# Patient Record
Sex: Female | Born: 1985 | Race: White | Hispanic: Yes | Marital: Married | Smoking: Former smoker
Health system: Southern US, Community
[De-identification: ages and names within clinical notes are randomized; demographics above are authoritative.]

## PROBLEM LIST (undated history)

## (undated) DIAGNOSIS — O24419 Gestational diabetes mellitus in pregnancy, unspecified control: Secondary | ICD-10-CM

## (undated) HISTORY — PX: COSMETIC SURGERY: SHX468

---

## 2015-02-25 NOTE — L&D Delivery Note (Signed)
Delivery Note At 4:40 AM a viable female was delivered via Vaginal, Spontaneous Delivery (Presentation:vertex ;LOA  ).  APGAR: 9, 9; weight  .   Placenta status:spontaneous ,duncan .  Cord: 3VC with the following complications:none .  Cord pH: n/a  Anesthesia:  epidural Episiotomy: None Lacerations: None Suture Repair: n/a Est. Blood Loss (mL):  100  Mom to postpartum.  Baby to Couplet care / Skin to Skin.  Cheryl Wolfe 11/26/2015, 4:54 AM

## 2015-04-03 LAB — OB RESULTS CONSOLE HGB/HCT, BLOOD
HEMATOCRIT: 42 %
HEMOGLOBIN: 13.6 g/dL

## 2015-04-03 LAB — OB RESULTS CONSOLE ABO/RH: RH TYPE: POSITIVE

## 2015-04-03 LAB — OB RESULTS CONSOLE HEPATITIS B SURFACE ANTIGEN: HEP B S AG: NEGATIVE

## 2015-04-03 LAB — OB RESULTS CONSOLE HIV ANTIBODY (ROUTINE TESTING): HIV: NONREACTIVE

## 2015-04-03 LAB — CYSTIC FIBROSIS DIAGNOSTIC STUDY: Interpretation-CFDNA:: NEGATIVE

## 2015-04-03 LAB — OB RESULTS CONSOLE PLATELET COUNT: Platelets: 413 10*3/uL

## 2015-04-03 LAB — OB RESULTS CONSOLE GC/CHLAMYDIA
Chlamydia: NEGATIVE
Gonorrhea: NEGATIVE

## 2015-04-04 LAB — CYTOLOGY - PAP: Pap: NEGATIVE

## 2015-10-15 ENCOUNTER — Encounter: Payer: Self-pay | Admitting: Obstetrics and Gynecology

## 2015-10-15 ENCOUNTER — Ambulatory Visit (INDEPENDENT_AMBULATORY_CARE_PROVIDER_SITE_OTHER): Payer: Medicaid Other | Admitting: Obstetrics and Gynecology

## 2015-10-15 VITALS — BP 102/72 | HR 74 | Ht 61.0 in | Wt 189.0 lb

## 2015-10-15 DIAGNOSIS — Z3483 Encounter for supervision of other normal pregnancy, third trimester: Secondary | ICD-10-CM | POA: Diagnosis not present

## 2015-10-15 DIAGNOSIS — Z3490 Encounter for supervision of normal pregnancy, unspecified, unspecified trimester: Secondary | ICD-10-CM | POA: Insufficient documentation

## 2015-10-15 MED ORDER — PREPLUS 27-1 MG PO TABS: 1.0000 | ORAL_TABLET | Freq: Every day | ORAL | 13 refills | Status: DC

## 2015-10-15 NOTE — Progress Notes (Signed)
Needs 28 wks labs Subjective:  Cheryl DessertDiana Wolfe is a 30 y.o. G4P3003 at 1630w6d being seen today for ongoing prenatal care. [facility] She reports that her OB care has been unremarkable. Has had TSVD x 3 without problems.   She is currently monitored for the following issues for this low-risk pregnancy and has Supervision of normal pregnancy, antepartum on her problem list.  Patient reports no complaints.  Contractions: Irregular. Vag. Bleeding: None.  Movement: Present. Denies leaking of fluid.   The following portions of the patient's history were reviewed and updated as appropriate: allergies, current medications, past family history, past medical history, past social history, past surgical history and problem list. Problem list updated.  Objective:   Vitals:   10/15/15 1027 10/15/15 1030  BP: 102/72   Pulse: 74   Weight: 189 lb (85.7 kg)   Height:  5\' 1"  (1.549 m)    Fetal Status:     Movement: Present     General:  Alert, oriented and cooperative. Patient is in no acute distress.  Skin: Skin is warm and dry. No rash noted.   Cardiovascular: Normal heart rate noted  Respiratory: Normal respiratory effort, no problems with respiration noted  Abdomen: Soft, gravid, appropriate for gestational age. Pain/Pressure: Present     Pelvic:  Cervical exam deferred        Extremities: Normal range of motion.  Edema: None  Mental Status: Normal mood and affect. Normal behavior. Normal judgment and thought content.   Urinalysis:      Assessment and Plan:  Pregnancy: G4P3003 at 4030w6d  1. Supervision of normal pregnancy, antepartum, third trimester - Glucose tolerance, 2 hours - CBC - HIV antibody - RPR - Tdap vaccine greater than or equal to 7yo IM  Preterm labor symptoms and general obstetric precautions including but not limited to vaginal bleeding, contractions, leaking of fluid and fetal movement were reviewed in detail with the patient. Please refer to After Visit  Summary for other counseling recommendations.  Return in about 2 weeks (around 10/29/2015) for OB visit.   Hermina StaggersMichael L Haydyn Liddell, MD

## 2015-10-15 NOTE — Addendum Note (Signed)
Addended by: Hermina StaggersERVIN, Johnye Kist L on: 10/15/2015 11:45 AM   Modules accepted: Orders

## 2015-10-17 ENCOUNTER — Other Ambulatory Visit: Payer: Medicaid Other

## 2015-10-19 ENCOUNTER — Other Ambulatory Visit: Payer: Medicaid Other

## 2015-10-19 DIAGNOSIS — Z3493 Encounter for supervision of normal pregnancy, unspecified, third trimester: Secondary | ICD-10-CM

## 2015-10-20 LAB — GLUCOSE TOLERANCE, 2 HOURS W/ 1HR
GLUCOSE, 1 HOUR: 147 mg/dL (ref 65–179)
GLUCOSE, 2 HOUR: 169 mg/dL — AB (ref 65–152)
Glucose, Fasting: 72 mg/dL (ref 65–91)

## 2015-10-22 LAB — CBC
HEMATOCRIT: 39.1 % (ref 34.0–46.6)
Hemoglobin: 12.6 g/dL (ref 11.1–15.9)
MCH: 28.3 pg (ref 26.6–33.0)
MCHC: 32.2 g/dL (ref 31.5–35.7)
MCV: 88 fL (ref 79–97)
Platelets: 335 10*3/uL (ref 150–379)
RBC: 4.45 x10E6/uL (ref 3.77–5.28)
RDW: 14.3 % (ref 12.3–15.4)
WBC: 12.6 10*3/uL — ABNORMAL HIGH (ref 3.4–10.8)

## 2015-10-22 LAB — GLUCOSE TOLERANCE, 2 HOURS

## 2015-10-22 LAB — HIV ANTIBODY (ROUTINE TESTING W REFLEX): HIV Screen 4th Generation wRfx: NONREACTIVE

## 2015-10-22 LAB — RPR: RPR Ser Ql: NONREACTIVE

## 2015-10-31 ENCOUNTER — Ambulatory Visit (INDEPENDENT_AMBULATORY_CARE_PROVIDER_SITE_OTHER): Payer: Medicaid Other | Admitting: Obstetrics and Gynecology

## 2015-10-31 VITALS — BP 99/71 | HR 92 | Temp 97.9°F | Wt 198.6 lb

## 2015-10-31 DIAGNOSIS — Z3483 Encounter for supervision of other normal pregnancy, third trimester: Secondary | ICD-10-CM

## 2015-10-31 LAB — POCT URINALYSIS DIPSTICK
Bilirubin, UA: NEGATIVE
Blood, UA: NEGATIVE
Glucose, UA: NEGATIVE
KETONES UA: NEGATIVE
Leukocytes, UA: NEGATIVE
Nitrite, UA: NEGATIVE
SPEC GRAV UA: 1.02
Urobilinogen, UA: 0.2
pH, UA: 7

## 2015-10-31 LAB — OB RESULTS CONSOLE GBS: STREP GROUP B AG: NEGATIVE

## 2015-10-31 NOTE — Progress Notes (Signed)
Subjective:  Cheryl Wolfe is a 30 y.o. [phone] at 2149w1d being seen today for ongoing prenatal care.  She is currently monitored for the following issues for this low-risk pregnancy and has Supervision of normal pregnancy, antepartum on her problem list.  Patient reports carpal tunnel symptoms.  Contractions: Irregular. Vag. Bleeding: None.  Movement: Present. Denies leaking of fluid.   The following portions of the patient's history were reviewed and updated as appropriate: allergies, current medications, past family history, past medical history, past social history, past surgical history and problem list. Problem list updated.  Objective:   Vitals:   10/31/15 1022  BP: 99/71  Pulse: 92  Temp: 97.9 F (36.6 C)  Weight: 198 lb 9.6 oz (90.1 kg)    Fetal Status:     Movement: Present     General:  Alert, oriented and cooperative. Patient is in no acute distress.  Skin: Skin is warm and dry. No rash noted.   Cardiovascular: Normal heart rate noted  Respiratory: Normal respiratory effort, no problems with respiration noted  Abdomen: Soft, gravid, appropriate for gestational age. Pain/Pressure: Present     Pelvic:  Cervical exam performed        Extremities: Normal range of motion.  Edema: Trace  Mental Status: Normal mood and affect. Normal behavior. Normal judgment and thought content.   Urinalysis:      Assessment and Plan:  Pregnancy: G4P3003 at 2749w1d  1. Supervision of normal pregnancy, antepartum, third trimester GBS collected today. CTS reviewed and Tx reviewed  Preterm labor symptoms and general obstetric precautions including but not limited to vaginal bleeding, contractions, leaking of fluid and fetal movement were reviewed in detail with the patient. Please refer to After Visit Summary for other counseling recommendations.  Return in about 1 week (around 11/07/2015) for OB visit.   Hermina StaggersMichael L Ervin, MD

## 2015-10-31 NOTE — Addendum Note (Signed)
Addended by: Hermina StaggersERVIN, MICHAEL L on: 10/31/2015 12:13 PM   Modules accepted: Orders

## 2015-10-31 NOTE — Progress Notes (Signed)
Pt. Denies questions or concerns at this time. Last ov pt. Stated she had exam and wanted to know the results.

## 2015-10-31 NOTE — Addendum Note (Signed)
Addended by: Francene FindersJAMES, QUINETTA C on: 10/31/2015 01:05 PM   Modules accepted: Orders

## 2015-11-05 LAB — CULTURE, BETA STREP (GROUP B ONLY): STREP GP B CULTURE: NEGATIVE

## 2015-11-08 ENCOUNTER — Ambulatory Visit (INDEPENDENT_AMBULATORY_CARE_PROVIDER_SITE_OTHER): Payer: Medicaid Other | Admitting: Advanced Practice Midwife

## 2015-11-08 VITALS — BP 112/79 | HR 96 | Wt 194.0 lb

## 2015-11-08 DIAGNOSIS — O368131 Decreased fetal movements, third trimester, fetus 1: Secondary | ICD-10-CM

## 2015-11-08 DIAGNOSIS — Z3483 Encounter for supervision of other normal pregnancy, third trimester: Secondary | ICD-10-CM

## 2015-11-08 NOTE — Progress Notes (Signed)
   PRENATAL VISIT NOTE  Subjective:  Cheryl Wolfe is a 30 y.o. [phone] at 2459w2d being seen today for ongoing prenatal care.  She is currently monitored for the following issues for this low-risk pregnancy and has Supervision of normal pregnancy, antepartum on her problem list.  Patient reports occasional contractions and baby not moving as much as usual.  Contractions: Irregular. Vag. Bleeding: None.  Movement: (!) Decreased. Denies leaking of fluid.   The following portions of the patient's history were reviewed and updated as appropriate: allergies, current medications, past family history, past medical history, past social history, past surgical history and problem list. Problem list updated.  Objective:   Vitals:   11/08/15 1541  BP: 112/79  Pulse: 96  Weight: 194 lb (88 kg)    Fetal Status: Fetal Heart Rate (bpm): NST-R   Movement: (!) Decreased     General:  Alert, oriented and cooperative. Patient is in no acute distress.  Skin: Skin is warm and dry. No rash noted.   Cardiovascular: Normal heart rate noted  Respiratory: Normal respiratory effort, no problems with respiration noted  Abdomen: Soft, gravid, appropriate for gestational age. Pain/Pressure: Present     Pelvic:  Cervical exam deferred        Extremities: Normal range of motion.     Mental Status: Normal mood and affect. Normal behavior. Normal judgment and thought content.   Urinalysis: Urine Protein: Negative Urine Glucose: Negative  Assessment and Plan:  Pregnancy: G4P3003 at 7659w2d  1. Decreased fetal movement, third trimester, fetus 1 --NST-R today.  Reviewed kick counts/reasons to come to hospital.  2. Supervision of normal pregnancy, antepartum, third trimester   Term labor symptoms and general obstetric precautions including but not limited to vaginal bleeding, contractions, leaking of fluid and fetal movement were reviewed in detail with the patient. Please refer to After Visit Summary for other  counseling recommendations.  No Follow-up on file.  Hurshel PartyLisa A Leftwich-Kirby, CNM

## 2015-11-08 NOTE — Progress Notes (Signed)
Pt states that she is having decrease FM. Pt states that she has not felt baby move much since last night.

## 2015-11-13 ENCOUNTER — Encounter: Payer: Medicaid Other | Admitting: Obstetrics and Gynecology

## 2015-11-15 ENCOUNTER — Ambulatory Visit (INDEPENDENT_AMBULATORY_CARE_PROVIDER_SITE_OTHER): Payer: Medicaid Other | Admitting: Family Medicine

## 2015-11-15 ENCOUNTER — Encounter: Payer: Self-pay | Admitting: *Deleted

## 2015-11-15 VITALS — BP 117/77 | HR 98 | Temp 98.2°F | Wt 195.4 lb

## 2015-11-15 DIAGNOSIS — O24419 Gestational diabetes mellitus in pregnancy, unspecified control: Secondary | ICD-10-CM | POA: Insufficient documentation

## 2015-11-15 DIAGNOSIS — Z3483 Encounter for supervision of other normal pregnancy, third trimester: Secondary | ICD-10-CM

## 2015-11-15 DIAGNOSIS — O24410 Gestational diabetes mellitus in pregnancy, diet controlled: Secondary | ICD-10-CM

## 2015-11-15 NOTE — Patient Instructions (Addendum)
Following an appropriate diet and keeping your blood sugar under control is the most important thing to do for your health and that of your unborn baby.  Goals for Blood sugar should be: 1. Fasting (first thing in the morning before eating) should be less than 90.   2.  2 hours after meals should be less than 120.  Please eat 3 meals and 3 snacks.  Include protein (meat, dairy-cheese, eggs, nuts) with all meals.  Be mindful that carbohydrates increase your blood sugar.  Not just sweet food (cookies, cake, donuts, fruit, juice, soda) but also bread, pasta, rice, and potatoes.  You have to limit how many carbs you are eating.  Adding exercise, as little as 30 minutes a day can decrease your blood sugar.  Third Trimester of Pregnancy The third trimester is from week 29 through week 42, months 7 through 9. The third trimester is a time when the fetus is growing rapidly. At the end of the ninth month, the fetus is about 20 inches in length and weighs 6-10 pounds.  BODY CHANGES Your body goes through many changes during pregnancy. The changes vary from woman to woman.   Your weight will continue to increase. You can expect to gain 25-35 pounds (11-16 kg) by the end of the pregnancy.  You may begin to get stretch marks on your hips, abdomen, and breasts.  You may urinate more often because the fetus is moving lower into your pelvis and pressing on your bladder.  You may develop or continue to have heartburn as a result of your pregnancy.  You may develop constipation because certain hormones are causing the muscles that push waste through your intestines to slow down.  You may develop hemorrhoids or swollen, bulging veins (varicose veins).  You may have pelvic pain because of the weight gain and pregnancy hormones relaxing your joints between the bones in your pelvis. Backaches may result from overexertion of the muscles supporting your posture.  You may have changes in your hair. These can  include thickening of your hair, rapid growth, and changes in texture. Some women also have hair loss during or after pregnancy, or hair that feels dry or thin. Your hair will most likely return to normal after your baby is born.  Your breasts will continue to grow and be tender. A yellow discharge may leak from your breasts called colostrum.  Your belly button may stick out.  You may feel short of breath because of your expanding uterus.  You may notice the fetus "dropping," or moving lower in your abdomen.  You may have a bloody mucus discharge. This usually occurs a few days to a week before labor begins.  Your cervix becomes thin and soft (effaced) near your due date. WHAT TO EXPECT AT YOUR PRENATAL EXAMS  You will have prenatal exams every 2 weeks until week 36. Then, you will have weekly prenatal exams. During a routine prenatal visit:  You will be weighed to make sure you and the fetus are growing normally.  Your blood pressure is taken.  Your abdomen will be measured to track your baby's growth.  The fetal heartbeat will be listened to.  Any test results from the previous visit will be discussed.  You may have a cervical check near your due date to see if you have effaced. At around 36 weeks, your caregiver will check your cervix. At the same time, your caregiver will also perform a test on the secretions of the vaginal  tissue. This test is to determine if a type of bacteria, Group B streptococcus, is present. Your caregiver will explain this further. Your caregiver may ask you:  What your birth plan is.  How you are feeling.  If you are feeling the baby move.  If you have had any abnormal symptoms, such as leaking fluid, bleeding, severe headaches, or abdominal cramping.  If you are using any tobacco products, including cigarettes, chewing tobacco, and electronic cigarettes.  If you have any questions. Other tests or screenings that may be performed during your third  trimester include:  Blood tests that check for low iron levels (anemia).  Fetal testing to check the health, activity level, and growth of the fetus. Testing is done if you have certain medical conditions or if there are problems during the pregnancy.  HIV (human immunodeficiency virus) testing. If you are at high risk, you may be screened for HIV during your third trimester of pregnancy. FALSE LABOR You may feel small, irregular contractions that eventually go away. These are called Braxton Hicks contractions, or false labor. Contractions may last for hours, days, or even weeks before true labor sets in. If contractions come at regular intervals, intensify, or become painful, it is best to be seen by your caregiver.  SIGNS OF LABOR   Menstrual-like cramps.  Contractions that are 5 minutes apart or less.  Contractions that start on the top of the uterus and spread down to the lower abdomen and back.  A sense of increased pelvic pressure or back pain.  A watery or bloody mucus discharge that comes from the vagina. If you have any of these signs before the 37th week of pregnancy, call your caregiver right away. You need to go to the hospital to get checked immediately. HOME CARE INSTRUCTIONS   Avoid all smoking, herbs, alcohol, and unprescribed drugs. These chemicals affect the formation and growth of the baby.  Do not use any tobacco products, including cigarettes, chewing tobacco, and electronic cigarettes. If you need help quitting, ask your health care provider. You may receive counseling support and other resources to help you quit.  Follow your caregiver's instructions regarding medicine use. There are medicines that are either safe or unsafe to take during pregnancy.  Exercise only as directed by your caregiver. Experiencing uterine cramps is a good sign to stop exercising.  Continue to eat regular, healthy meals.  Wear a good support bra for breast tenderness.  Do not use hot  tubs, steam rooms, or saunas.  Wear your seat belt at all times when driving.  Avoid raw meat, uncooked cheese, cat litter boxes, and soil used by cats. These carry germs that can cause birth defects in the baby.  Take your prenatal vitamins.  Take 1500-2000 mg of calcium daily starting at the 20th week of pregnancy until you deliver your baby.  Try taking a stool softener (if your caregiver approves) if you develop constipation. Eat more high-fiber foods, such as fresh vegetables or fruit and whole grains. Drink plenty of fluids to keep your urine clear or pale yellow.  Take warm sitz baths to soothe any pain or discomfort caused by hemorrhoids. Use hemorrhoid cream if your caregiver approves.  If you develop varicose veins, wear support hose. Elevate your feet for 15 minutes, 3-4 times a day. Limit salt in your diet.  Avoid heavy lifting, wear low heal shoes, and practice good posture.  Rest a lot with your legs elevated if you have leg cramps or low  back pain.  Visit your dentist if you have not gone during your pregnancy. Use a soft toothbrush to brush your teeth and be gentle when you floss.  A sexual relationship may be continued unless your caregiver directs you otherwise.  Do not travel far distances unless it is absolutely necessary and only with the approval of your caregiver.  Take prenatal classes to understand, practice, and ask questions about the labor and delivery.  Make a trial run to the hospital.  Pack your hospital bag.  Prepare the baby's nursery.  Continue to go to all your prenatal visits as directed by your caregiver. SEEK MEDICAL CARE IF:  You are unsure if you are in labor or if your water has broken.  You have dizziness.  You have mild pelvic cramps, pelvic pressure, or nagging pain in your abdominal area.  You have persistent nausea, vomiting, or diarrhea.  You have a bad smelling vaginal discharge.  You have pain with urination. SEEK  IMMEDIATE MEDICAL CARE IF:   You have a fever.  You are leaking fluid from your vagina.  You have spotting or bleeding from your vagina.  You have severe abdominal cramping or pain.  You have rapid weight loss or gain.  You have shortness of breath with chest pain.  You notice sudden or extreme swelling of your face, hands, ankles, feet, or legs.  You have not felt your baby move in over an hour.  You have severe headaches that do not go away with medicine.  You have vision changes.   This information is not intended to replace advice given to you by your health care provider. Make sure you discuss any questions you have with your health care provider.   Document Released: 02/04/2001 Document Revised: 03/03/2014 Document Reviewed: 04/13/2012 Else[employer] Interactive Patient Education [employer]   Breastfeeding Deciding to breastfeed is one of the best choices you can make for you and your baby. A change in hormones during pregnancy causes your breast tissue to grow and increases the number and size of your milk ducts. These hormones also allow proteins, sugars, and fats from your blood supply to make breast milk in your milk-producing glands. Hormones prevent breast milk from being released before your baby is born as well as prompt milk flow after birth. Once breastfeeding has begun, thoughts of your baby, as well as his or her sucking or crying, can stimulate the release of milk from your milk-producing glands.  BENEFITS OF BREASTFEEDING For Your Baby  Your first milk (colostrum) helps your baby's digestive system function better.  There are antibodies in your milk that help your baby fight off infections.  Your baby has a lower incidence of asthma, allergies, and sudden infant death syndrome.  The nutrients in breast milk are better for your baby than infant formulas and are designed uniquely for your baby's needs.  Breast milk improves your baby's brain  development.  Your baby is less likely to develop other conditions, such as childhood obesity, asthma, or type 2 diabetes mellitus. For You  Breastfeeding helps to create a very special bond between you and your baby.  Breastfeeding is convenient. Breast milk is always available at the correct temperature and costs nothing.  Breastfeeding helps to burn calories and helps you lose the weight gained during pregnancy.  Breastfeeding makes your uterus contract to its prepregnancy size faster and slows bleeding (lochia) after you give birth.   Breastfeeding helps to lower your risk of developing type 2  diabetes mellitus, osteoporosis, and breast or ovarian cancer later in life. SIGNS THAT YOUR BABY IS HUNGRY Early Signs of Hunger  Increased alertness or activity.  Stretching.  Movement of the head from side to side.  Movement of the head and opening of the mouth when the corner of the mouth or cheek is stroked (rooting).  Increased sucking sounds, smacking lips, cooing, sighing, or squeaking.  Hand-to-mouth movements.  Increased sucking of fingers or hands. Late Signs of Hunger  Fussing.  Intermittent crying. Extreme Signs of Hunger Signs of extreme hunger will require calming and consoling before your baby will be able to breastfeed successfully. Do not wait for the following signs of extreme hunger to occur before you initiate breastfeeding:  Restlessness.  A loud, strong cry.  Screaming. BREASTFEEDING BASICS Breastfeeding Initiation  Find a comfortable place to sit or lie down, with your neck and back well supported.  Place a pillow or rolled up blanket under your baby to bring him or her to the level of your breast (if you are seated). Nursing pillows are specially designed to help support your arms and your baby while you breastfeed.  Make sure that your baby's abdomen is facing your abdomen.  Gently massage your breast. With your fingertips, massage from your  chest wall toward your nipple in a circular motion. This encourages milk flow. You may need to continue this action during the feeding if your milk flows slowly.  Support your breast with 4 fingers underneath and your thumb above your nipple. Make sure your fingers are well away from your nipple and your baby's mouth.  Stroke your baby's lips gently with your finger or nipple.  When your baby's mouth is open wide enough, quickly bring your baby to your breast, placing your entire nipple and as much of the colored area around your nipple (areola) as possible into your baby's mouth.  More areola should be visible above your baby's upper lip than below the lower lip.  Your baby's tongue should be between his or her lower gum and your breast.  Ensure that your baby's mouth is correctly positioned around your nipple (latched). Your baby's lips should create a seal on your breast and be turned out (everted).  It is common for your baby to suck about 2-3 minutes in order to start the flow of breast milk. Latching Teaching your baby how to latch on to your breast properly is very important. An improper latch can cause nipple pain and decreased milk supply for you and poor weight gain in your baby. Also, if your baby is not latched onto your nipple properly, he or she may swallow some air during feeding. This can make your baby fussy. Burping your baby when you switch breasts during the feeding can help to get rid of the air. However, teaching your baby to latch on properly is still the best way to prevent fussiness from swallowing air while breastfeeding. Signs that your baby has successfully latched on to your nipple:  Silent tugging or silent sucking, without causing you pain.  Swallowing heard between every 3-4 sucks.  Muscle movement above and in front of his or her ears while sucking. Signs that your baby has not successfully latched on to nipple:  Sucking sounds or smacking sounds from your  baby while breastfeeding.  Nipple pain. If you think your baby has not latched on correctly, slip your finger into the corner of your baby's mouth to break the suction and place it between  your baby's gums. Attempt breastfeeding initiation again. Signs of Successful Breastfeeding Signs from your baby:  A gradual decrease in the number of sucks or complete cessation of sucking.  Falling asleep.  Relaxation of his or her body.  Retention of a small amount of milk in his or her mouth.  Letting go of your breast by himself or herself. Signs from you:  Breasts that have increased in firmness, weight, and size 1-3 hours after feeding.  Breasts that are softer immediately after breastfeeding.  Increased milk volume, as well as a change in milk consistency and color by the fifth day of breastfeeding.  Nipples that are not sore, cracked, or bleeding. Signs That Your Pecola Leisure is Getting Enough Milk  Wetting at least 3 diapers in a 24-hour period. The urine should be clear and pale yellow by age 62 days.  At least 3 stools in a 24-hour period by age 62 days. The stool should be soft and yellow.  At least 3 stools in a 24-hour period by age 14 days. The stool should be seedy and yellow.  No loss of weight greater than 10% of birth weight during the first 83 days of age.  Average weight gain of 4-7 ounces (113-198 g) per week after age 71 days.  Consistent daily weight gain by age 62 days, without weight loss after the age of 2 weeks. After a feeding, your baby may spit up a small amount. This is common. BREASTFEEDING FREQUENCY AND DURATION Frequent feeding will help you make more milk and can prevent sore nipples and breast engorgement. Breastfeed when you feel the need to reduce the fullness of your breasts or when your baby shows signs of hunger. This is called "breastfeeding on demand." Avoid introducing a pacifier to your baby while you are working to establish breastfeeding (the first 4-6  weeks after your baby is born). After this time you may choose to use a pacifier. Research has shown that pacifier use during the first year of a baby's life decreases the risk of sudden infant death syndrome (SIDS). Allow your baby to feed on each breast as long as he or she wants. Breastfeed until your baby is finished feeding. When your baby unlatches or falls asleep while feeding from the first breast, offer the second breast. Because newborns are often sleepy in the first few weeks of life, you may need to awaken your baby to get him or her to feed. Breastfeeding times will vary from baby to baby. However, the following rules can serve as a guide to help you ensure that your baby is properly fed:  Newborns (babies 17 weeks of age or younger) may breastfeed every 1-3 hours.  Newborns should not go longer than 3 hours during the day or 5 hours during the night without breastfeeding.  You should breastfeed your baby a minimum of 8 times in a 24-hour period until you begin to introduce solid foods to your baby at around 21 months of age. BREAST MILK PUMPING Pumping and storing breast milk allows you to ensure that your baby is exclusively fed your breast milk, even at times when you are unable to breastfeed. This is especially important if you are going back to work while you are still breastfeeding or when you are not able to be present during feedings. Your lactation consultant can give you guidelines on how long it is safe to store breast milk. A breast pump is a machine that allows you to pump milk from your  breast into a sterile bottle. The pumped breast milk can then be stored in a refrigerator or freezer. Some breast pumps are operated by hand, while others use electricity. Ask your lactation consultant which type will work best for you. Breast pumps can be purchased, but some hospitals and breastfeeding support groups lease breast pumps on a monthly basis. A lactation consultant can teach you how  to hand express breast milk, if you prefer not to use a pump. CARING FOR YOUR BREASTS WHILE YOU BREASTFEED Nipples can become dry, cracked, and sore while breastfeeding. The following recommendations can help keep your breasts moisturized and healthy:  Avoid using soap on your nipples.  Wear a supportive bra. Although not required, special nursing bras and tank tops are designed to allow access to your breasts for breastfeeding without taking off your entire bra or top. Avoid wearing underwire-style bras or extremely tight bras.  Air dry your nipples for 3-62minutes after each feeding.  Use only cotton bra pads to absorb leaked breast milk. Leaking of breast milk between feedings is normal.  Use lanolin on your nipples after breastfeeding. Lanolin helps to maintain your skin's normal moisture barrier. If you use pure lanolin, you do not need to wash it off before feeding your baby again. Pure lanolin is not toxic to your baby. You may also hand express a few drops of breast milk and gently massage that milk into your nipples and allow the milk to air dry. In the first few weeks after giving birth, some women experience extremely full breasts (engorgement). Engorgement can make your breasts feel heavy, warm, and tender to the touch. Engorgement peaks within 3-5 days after you give birth. The following recommendations can help ease engorgement:  Completely empty your breasts while breastfeeding or pumping. You may want to start by applying warm, moist heat (in the shower or with warm water-soaked hand towels) just before feeding or pumping. This increases circulation and helps the milk flow. If your baby does not completely empty your breasts while breastfeeding, pump any extra milk after he or she is finished.  Wear a snug bra (nursing or regular) or tank top for 1-2 days to signal your body to slightly decrease milk production.  Apply ice packs to your breasts, unless this is too uncomfortable for  you.  Make sure that your baby is latched on and positioned properly while breastfeeding. If engorgement persists after 48 hours of following these recommendations, contact your health care provider or a Advertising copywriter. OVERALL HEALTH CARE RECOMMENDATIONS WHILE BREASTFEEDING  Eat healthy foods. Alternate between meals and snacks, eating 3 of each per day. Because what you eat affects your breast milk, some of the foods may make your baby more irritable than usual. Avoid eating these foods if you are sure that they are negatively affecting your baby.  Drink milk, fruit juice, and water to satisfy your thirst (about 10 glasses a day).  Rest often, relax, and continue to take your prenatal vitamins to prevent fatigue, stress, and anemia.  Continue breast self-awareness checks.  Avoid chewing and smoking tobacco. Chemicals from cigarettes that pass into breast milk and exposure to secondhand smoke may harm your baby.  Avoid alcohol and drug use, including marijuana. Some medicines that may be harmful to your baby can pass through breast milk. It is important to ask your health care provider before taking any medicine, including all over-the-counter and prescription medicine as well as vitamin and herbal supplements. It is possible to become pregnant  while breastfeeding. If birth control is desired, ask your health care provider about options that will be safe for your baby. SEEK MEDICAL CARE IF:  You feel like you want to stop breastfeeding or have become frustrated with breastfeeding.  You have painful breasts or nipples.  Your nipples are cracked or bleeding.  Your breasts are red, tender, or warm.  You have a swollen area on either breast.  You have a fever or chills.  You have nausea or vomiting.  You have drainage other than breast milk from your nipples.  Your breasts do not become full before feedings by the fifth day after you give birth.  You feel sad and  depressed.  Your baby is too sleepy to eat well.  Your baby is having trouble sleeping.   Your baby is wetting less than 3 diapers in a 24-hour period.  Your baby has less than 3 stools in a 24-hour period.  Your baby's skin or the white part of his or her eyes becomes yellow.   Your baby is not gaining weight by 55 days of age. SEEK IMMEDIATE MEDICAL CARE IF:  Your baby is overly tired (lethargic) and does not want to wake up and feed.  Your baby develops an unexplained fever.   This information is not intended to replace advice given to you by your health care provider. Make sure you discuss any questions you have with your health care provider.   Document Released: 02/10/2005 Document Revised: 11/01/2014 Document Reviewed: 08/04/2012 [employer] Interactive Patient Education [employer]

## 2015-11-15 NOTE — Progress Notes (Signed)
   PRENATAL VISIT NOTE  Subjective:  Cheryl Wolfe is a 30 y.o. [phone] at 5752w4d being seen today for ongoing prenatal care.  She is currently monitored for the following issues for this high-risk pregnancy and has Supervision of normal pregnancy, antepartum and Gestational diabetes on her problem list.  Patient reports no complaints.  Contractions: Irregular. Vag. Bleeding: None.  Movement: Present. Denies leaking of fluid.   The following portions of the patient's history were reviewed and updated as appropriate: allergies, current medications, past family history, past medical history, past social history, past surgical history and problem list. Problem list updated.  Objective:   Vitals:   11/15/15 1546  BP: 117/77  Pulse: 98  Temp: 98.2 F (36.8 C)  Weight: 195 lb 6.4 oz (88.6 kg)    Fetal Status: Fetal Heart Rate (bpm): 140 Fundal Height: 33 cm Movement: Present  Presentation: Vertex  General:  Alert, oriented and cooperative. Patient is in no acute distress.  Skin: Skin is warm and dry. No rash noted.   Cardiovascular: Normal heart rate noted  Respiratory: Normal respiratory effort, no problems with respiration noted  Abdomen: Soft, gravid, appropriate for gestational age. Pain/Pressure: Present     Pelvic:  Cervical exam performed Dilation: 1 Effacement (%): 50 Station: -3  Extremities: Normal range of motion.  Edema: Trace  Mental Status: Normal mood and affect. Normal behavior. Normal judgment and thought content.   Urinalysis: Urine Protein: Trace Urine Glucose: Trace Random BS 143 3 hours after a meal Assessment and Plan:  Pregnancy: G4P3003 at 6552w4d  1. Supervision of normal pregnancy, antepartum, third trimester Continue prenatal care.   2. Diet controlled gestational diabetes mellitus in third trimester Too late for teaching and BS will induce her at 39 wks. Return for a fasting BS Begin diet U/S for growth  - US MFM OB COMP + 14 WK; Future  Term  labor symptoms and general obstetric precautions including but not limited to vaginal bleeding, contractions, leaking of fluid and fetal movement were reviewed in detail with the patient. Please refer to After Visit Summary for other counseling recommendations.  Return in 1 week (on 11/22/2015).  Reva Boresanya S Sherby Moncayo, MD

## 2015-11-19 ENCOUNTER — Ambulatory Visit (HOSPITAL_COMMUNITY): Admission: RE | Admit: 2015-11-19 | Payer: Self-pay | Source: Ambulatory Visit

## 2015-11-19 ENCOUNTER — Telehealth (HOSPITAL_COMMUNITY): Payer: Self-pay | Admitting: *Deleted

## 2015-11-19 ENCOUNTER — Other Ambulatory Visit (INDEPENDENT_AMBULATORY_CARE_PROVIDER_SITE_OTHER): Payer: Medicaid Other | Admitting: Obstetrics and Gynecology

## 2015-11-19 VITALS — BP 119/76 | HR 54 | Temp 97.9°F | Wt 195.0 lb

## 2015-11-19 DIAGNOSIS — O4700 False labor before 37 completed weeks of gestation, unspecified trimester: Secondary | ICD-10-CM

## 2015-11-19 DIAGNOSIS — Z131 Encounter for screening for diabetes mellitus: Secondary | ICD-10-CM

## 2015-11-19 DIAGNOSIS — Z3483 Encounter for supervision of other normal pregnancy, third trimester: Secondary | ICD-10-CM

## 2015-11-19 DIAGNOSIS — Z331 Pregnant state, incidental: Secondary | ICD-10-CM

## 2015-11-19 DIAGNOSIS — O24410 Gestational diabetes mellitus in pregnancy, diet controlled: Secondary | ICD-10-CM

## 2015-11-19 LAB — POCT CBG (FASTING - GLUCOSE)-MANUAL ENTRY: Glucose Fasting, POC: 81 mg/dL (ref 70–99)

## 2015-11-19 NOTE — Telephone Encounter (Signed)
Preadmission screen  

## 2015-11-19 NOTE — Progress Notes (Signed)
   PRENATAL VISIT NOTE  Subjective:  Cheryl Wolfe is a 30 y.o. G4P3003 at 4094w1d being seen today for ongoing prenatal care.  She is currently monitored for the following issues for this high-risk pregnancy and has Supervision of normal pregnancy, antepartum and Gestational diabetes on her problem list.  Patient reports contractions since ysterday evening.  Contractions: Irregular. Vag. Bleeding: None.  Movement: Present. Denies leaking of fluid.   The following portions of the patient's history were reviewed and updated as appropriate: allergies, current medications, past family history, past medical history, past social history, past surgical history and problem list. Problem list updated.  Objective:   Vitals:   11/19/15 1025  BP: 119/76  Pulse: (!) 54  Temp: 97.9 F (36.6 C)  Weight: 195 lb (88.5 kg)    Fetal Status: Fetal Heart Rate (bpm): NST   Movement: Present  Presentation: Vertex  General:  Alert, oriented and cooperative. Patient is in no acute distress.  Skin: Skin is warm and dry. No rash noted.   Cardiovascular: Normal heart rate noted  Respiratory: Normal respiratory effort, no problems with respiration noted  Abdomen: Soft, gravid, appropriate for gestational age. Pain/Pressure: Present     Pelvic:  Cervical exam performed Dilation: 1.5 Effacement (%): 50 Station: -3  Extremities: Normal range of motion.  Edema: Trace  Mental Status: Normal mood and affect. Normal behavior. Normal judgment and thought content.   Urinalysis:      Assessment and Plan:  Pregnancy: G4P3003 at 3894w1d  1. Threatened premature labor, antepartum Reassurance and labor precautions reviewed - Fetal nonstress test; Future  2. Diet controlled gestational diabetes mellitus in third trimester Fasting CBG 81 today NST reviewed and reactive. Performed secondary to contractions Patient scheduled for IOL at 39 weeks (10/1 at 7) secondary to late management of GDM  4. Supervision of normal  pregnancy, antepartum, third trimester Patient desires to keep ROB appointment this thursday  Term labor symptoms and general obstetric precautions including but not limited to vaginal bleeding, contractions, leaking of fluid and fetal movement were reviewed in detail with the patient. Please refer to After Visit Summary for other counseling recommendations.  No Follow-up on file.  Catalina AntiguaPeggy Amos Gaber, MD

## 2015-11-19 NOTE — Progress Notes (Signed)
Patient was in the office to check her glucose- she is concerned about the contractions she is having and wants her cervix checked.

## 2015-11-19 NOTE — Progress Notes (Signed)
Patient here for fasting lab only.  Complaining of contractions.  Placed patient on nst.

## 2015-11-21 ENCOUNTER — Other Ambulatory Visit: Payer: Self-pay | Admitting: Family Medicine

## 2015-11-21 ENCOUNTER — Ambulatory Visit (HOSPITAL_COMMUNITY)
Admission: RE | Admit: 2015-11-21 | Discharge: 2015-11-21 | Disposition: A | Discharge status: Home / Self Care | Payer: Medicaid Other | Source: Ambulatory Visit | Attending: Family Medicine | Admitting: Family Medicine

## 2015-11-21 ENCOUNTER — Encounter (HOSPITAL_COMMUNITY): Payer: Self-pay

## 2015-11-21 DIAGNOSIS — Z3689 Encounter for other specified antenatal screening: Secondary | ICD-10-CM

## 2015-11-21 DIAGNOSIS — O24410 Gestational diabetes mellitus in pregnancy, diet controlled: Secondary | ICD-10-CM | POA: Diagnosis present

## 2015-11-21 DIAGNOSIS — O99213 Obesity complicating pregnancy, third trimester: Secondary | ICD-10-CM

## 2015-11-21 DIAGNOSIS — Z3A38 38 weeks gestation of pregnancy: Secondary | ICD-10-CM | POA: Diagnosis not present

## 2015-11-21 DIAGNOSIS — Z36 Encounter for antenatal screening of mother: Secondary | ICD-10-CM | POA: Insufficient documentation

## 2015-11-21 HISTORY — DX: Gestational diabetes mellitus in pregnancy, unspecified control: O24.419

## 2015-11-21 NOTE — Addendum Note (Signed)
Encounter addended by: Jason FilaMesha T Aliesha Dolata on: 11/21/2015 10:10 AM<BR>    Actions taken: Imaging Exam ended

## 2015-11-22 ENCOUNTER — Encounter: Payer: Medicaid Other | Admitting: Obstetrics and Gynecology

## 2015-11-22 ENCOUNTER — Telehealth: Payer: Self-pay | Admitting: Obstetrics and Gynecology

## 2015-11-22 NOTE — Telephone Encounter (Signed)
Pt came in for appt that she thought was at 4:15p and appt was at 1:15pm. Pt did want to get the results of her U/S read to her, since she missed her appt. Please advise.

## 2015-11-22 NOTE — Telephone Encounter (Signed)
Please review u/s and advise on results to be given to patient.

## 2015-11-25 ENCOUNTER — Encounter (HOSPITAL_COMMUNITY): Payer: Self-pay

## 2015-11-25 ENCOUNTER — Inpatient Hospital Stay (HOSPITAL_COMMUNITY)
Admission: RE | Admit: 2015-11-25 | Discharge: 2015-11-27 | DRG: 775 | Disposition: A | Discharge status: Home / Self Care | Payer: Medicaid Other | Source: Ambulatory Visit | Attending: Family Medicine | Admitting: Family Medicine

## 2015-11-25 DIAGNOSIS — Z3A39 39 weeks gestation of pregnancy: Secondary | ICD-10-CM | POA: Diagnosis not present

## 2015-11-25 DIAGNOSIS — Z3490 Encounter for supervision of normal pregnancy, unspecified, unspecified trimester: Secondary | ICD-10-CM

## 2015-11-25 DIAGNOSIS — O24410 Gestational diabetes mellitus in pregnancy, diet controlled: Secondary | ICD-10-CM

## 2015-11-25 DIAGNOSIS — Z3483 Encounter for supervision of other normal pregnancy, third trimester: Secondary | ICD-10-CM

## 2015-11-25 DIAGNOSIS — Z87891 Personal history of nicotine dependence: Secondary | ICD-10-CM

## 2015-11-25 DIAGNOSIS — O24420 Gestational diabetes mellitus in childbirth, diet controlled: Secondary | ICD-10-CM | POA: Diagnosis present

## 2015-11-25 LAB — CBC
HCT: 38.6 % (ref 36.0–46.0)
Hemoglobin: 13.5 g/dL (ref 12.0–15.0)
MCH: 29.2 pg (ref 26.0–34.0)
MCHC: 35 g/dL (ref 30.0–36.0)
MCV: 83.5 fL (ref 78.0–100.0)
PLATELETS: 283 10*3/uL (ref 150–400)
RBC: 4.62 MIL/uL (ref 3.87–5.11)
RDW: 14.1 % (ref 11.5–15.5)
WBC: 12.8 10*3/uL — ABNORMAL HIGH (ref 4.0–10.5)

## 2015-11-25 LAB — ABO/RH: ABO/RH(D): O POS

## 2015-11-25 LAB — TYPE AND SCREEN
ABO/RH(D): O POS
Antibody Screen: NEGATIVE

## 2015-11-25 LAB — GLUCOSE, CAPILLARY
GLUCOSE-CAPILLARY: 75 mg/dL (ref 65–99)
Glucose-Capillary: 78 mg/dL (ref 65–99)
Glucose-Capillary: 111 mg/dL — ABNORMAL HIGH (ref 65–99)
Glucose-Capillary: 67 mg/dL (ref 65–99)

## 2015-11-25 MED ORDER — MISOPROSTOL 200 MCG PO TABS
50.0000 ug | ORAL_TABLET | ORAL | Status: DC
  Administered 2015-11-25: 50 ug via ORAL
  Filled 2015-11-25: qty 0.5

## 2015-11-25 MED ORDER — LACTATED RINGERS IV SOLN
INTRAVENOUS | Status: DC
  Administered 2015-11-25: 23:00:00 via INTRAVENOUS

## 2015-11-25 MED ORDER — OXYCODONE-ACETAMINOPHEN 5-325 MG PO TABS: 2.0000 | ORAL_TABLET | ORAL | Status: DC | PRN

## 2015-11-25 MED ORDER — LIDOCAINE HCL (PF) 1 % IJ SOLN
30.0000 mL | INTRAMUSCULAR | Status: DC | PRN
  Filled 2015-11-25: qty 30

## 2015-11-25 MED ORDER — LACTATED RINGERS IV SOLN: 500.0000 mL | INTRAVENOUS | Status: DC | PRN

## 2015-11-25 MED ORDER — FLEET ENEMA 7-19 GM/118ML RE ENEM: 1.0000 | ENEMA | Freq: Every day | RECTAL | Status: DC | PRN

## 2015-11-25 MED ORDER — OXYTOCIN BOLUS FROM INFUSION
500.0000 mL | Freq: Once | INTRAVENOUS | Status: AC
  Administered 2015-11-26: 500 mL via INTRAVENOUS

## 2015-11-25 MED ORDER — SOD CITRATE-CITRIC ACID 500-334 MG/5ML PO SOLN: 30.0000 mL | ORAL | Status: DC | PRN

## 2015-11-25 MED ORDER — TERBUTALINE SULFATE 1 MG/ML IJ SOLN
0.2500 mg | Freq: Once | INTRAMUSCULAR | Status: DC | PRN
  Filled 2015-11-25: qty 1

## 2015-11-25 MED ORDER — OXYCODONE-ACETAMINOPHEN 5-325 MG PO TABS: 1.0000 | ORAL_TABLET | ORAL | Status: DC | PRN

## 2015-11-25 MED ORDER — ACETAMINOPHEN 325 MG PO TABS: 650.0000 mg | ORAL_TABLET | ORAL | Status: DC | PRN

## 2015-11-25 MED ORDER — OXYTOCIN 40 UNITS IN LACTATED RINGERS INFUSION - SIMPLE MED
1.0000 m[IU]/min | INTRAVENOUS | Status: DC
  Administered 2015-11-25: 2 m[IU]/min via INTRAVENOUS
  Filled 2015-11-25: qty 1000

## 2015-11-25 MED ORDER — OXYTOCIN 40 UNITS IN LACTATED RINGERS INFUSION - SIMPLE MED
2.5000 [IU]/h | INTRAVENOUS | Status: DC
  Administered 2015-11-26: 2.5 [IU]/h via INTRAVENOUS

## 2015-11-25 MED ORDER — ONDANSETRON HCL 4 MG/2ML IJ SOLN: 4.0000 mg | Freq: Four times a day (QID) | INTRAMUSCULAR | Status: DC | PRN

## 2015-11-25 NOTE — Anesthesia Pain Management Evaluation Note (Signed)
  CRNA Pain Management Visit Note  Patient: Cheryl Wolfe, 30 y.o., female  "Hello I am a member of the anesthesia team at [facility]. We have an anesthesia team available at all times to provide care throughout the hospital, including epidural management and anesthesia for C-section. I don't know your plan for the delivery whether it a natural birth, water birth, IV sedation, nitrous supplementation, doula or epidural, but we want to meet your pain goals."   1.Was your pain managed to your expectations on prior hospitalizations?   Yes   2.What is your expectation for pain management during this hospitalization?     Patient may try natural open to epidural 3.How can we help you reach that goal? Patient open to epidural  Record the patient's initial score and the patient's pain goal.   Pain: 5  Pain Goal: 10 [facility] wants you to be able to say your pain was always managed very well.  [name],[name] 11/25/2015

## 2015-11-25 NOTE — Progress Notes (Signed)
Blima DessertDiana Hernandez is a 30 y.o. [phone] at 7873w0d by ultrasound admitted for induction of labor due to Gestational diabetes.  Subjective:   Objective: BP 117/79   Pulse (!) 107   Temp 98.2 F (36.8 C)   Resp 16   Ht 5\' 1"  (1.549 m)   Wt 196 lb (88.9 kg)   LMP 02/25/2015   BMI 37.03 kg/m  No intake/output data recorded. No intake/output data recorded.  FHT:  FHR: 130-140's bpm, variability: moderate,  accelerations:  Present,  decelerations:  Absent UC:   regular, every 3-5 minutes SVE:   Dilation: 3 Effacement (%): 60 Station: Ballotable Exam by:: ProspectAshley, SNM  Labs: Lab Results  Component Value Date   WBC 12.8 (H) 11/25/2015   HGB 13.5 11/25/2015   HCT 38.6 11/25/2015   MCV 83.5 11/25/2015   PLT 283 11/25/2015    Assessment / Plan: IOL, on oxytocin, not yet in labor  Labor: Not yet in labor  Preeclampsia:  no signs or symptoms of toxicity, intake and ouput balanced and labs stable Fetal Wellbeing:  Category I Pain Control:  Labor support without medications I/D:  n/a Anticipated MOD:  NSVD  Renetta Chalkshley Ellis 11/25/2015, 9:25 PM

## 2015-11-25 NOTE — Progress Notes (Signed)
Cheryl Wolfe is a 30 y.o. [phone] at 5255w0d by ultrasound admitted for induction of labor due to Gestational diabetes.  Subjective:   Objective: BP 117/79   Pulse (!) 107   Temp 98.2 F (36.8 C)   Resp 16   Ht 5\' 1"  (1.549 m)   Wt 196 lb (88.9 kg)   LMP 02/25/2015   BMI 37.03 kg/m  No intake/output data recorded. No intake/output data recorded.  FHT:  FHR: 130 bpm, variability: moderate,  accelerations:  Present,  decelerations:  Absent UC:   irregular, every 3-6 minutes SVE:   Dilation: 3 Effacement (%): 60 Station: Ballotable Exam by:: Cheryl Wolfe, SNM  Labs: Lab Results  Component Value Date   WBC 12.8 (H) 11/25/2015   HGB 13.5 11/25/2015   HCT 38.6 11/25/2015   MCV 83.5 11/25/2015   PLT 283 11/25/2015    Assessment / Plan: IOL, not yet in labor; foley bulb placed  Labor: not yet in labor  Preeclampsia:  no signs or symptoms of toxicity, intake and ouput balanced and labs stable Fetal Wellbeing:  Category I Pain Control:  Labor support without medications I/D:  n/a Anticipated MOD:  NSVD  Cheryl Wolfe 11/25/2015, 9:28 PM

## 2015-11-25 NOTE — H&P (Signed)
Cheryl Wolfe is a 30 y.o. female @ 39.0 wks G4P3003 presenting for IOL for A1DM. Denies any VB or LOF. GBS neg.  OB History    Gravida Para Term Preterm AB Living   4 3 3     3    SAB TAB Ectopic Multiple Live Births           3     Past Medical History:  Diagnosis Date  . Gestational diabetes    Past Surgical History:  Procedure Laterality Date  . COSMETIC SURGERY     Tummy Tuck    Family History: family history is not on file. Social History:  reports that she has quit smoking. She has never used smokeless tobacco. She reports that she does not drink alcohol or use drugs.     Maternal Diabetes: Yes:  Diabetes Type:  Diet controlled Genetic Screening: Normal Maternal Ultrasounds/Referrals: Normal Fetal Ultrasounds or other Referrals:  None Maternal Substance Abuse:  No Significant Maternal Medications:  None Significant Maternal Lab Results:  None Other Comments:  None  Review of Systems  Constitutional: Negative.   HENT: Negative.   Eyes: Negative.   Respiratory: Negative.   Cardiovascular: Negative.   Gastrointestinal: Negative.   Genitourinary: Negative.   Musculoskeletal: Negative.   Skin: Negative.   Neurological: Negative.   Endo/Heme/Allergies: Negative.   Psychiatric/Behavioral: Negative.    Maternal Medical History:  Reason for admission: IOL for A1DM  Contractions: Onset was 1-2 hours ago.   Frequency: irregular.   Perceived severity is mild.    Fetal activity: Perceived fetal activity is normal.   Last perceived fetal movement was within the past hour.    Prenatal complications: no prenatal complications Prenatal Complications - Diabetes: gestational. Diabetes is managed by diet.      Dilation: 1 Effacement (%): Thick Station: Ballotable Exam by:: Penne Lash, MD Blood pressure 110/80, pulse 87, temperature 97.9 F (36.6 C), temperature source Oral, resp. rate 18, height 5\' 1"  (1.549 m), weight 196 lb (88.9 kg), last menstrual period  02/25/2015. Maternal Exam:  Uterine Assessment: Contraction strength is mild.  Contraction frequency is irregular.   Abdomen: Patient reports no abdominal tenderness. Estimated fetal weight is 8-0.   Fetal presentation: vertex  Introitus: Normal vulva. Normal vagina.  Ferning test: not done.  Nitrazine test: not done. Amniotic fluid character: not assessed.  Pelvis: adequate for delivery.   Cervix: Cervix evaluated by digital exam.     Fetal Exam Fetal Monitor Review: Mode: ultrasound.   Variability: moderate (6-25 bpm).   Pattern: accelerations present and no decelerations.    Fetal State Assessment: Category I - tracings are normal.     Physical Exam  Constitutional: She is oriented to person, place, and time. She appears well-developed and well-nourished.  HENT:  Head: Normocephalic.  Eyes: Pupils are equal, round, and reactive to light.  Neck: Normal range of motion.  Cardiovascular: Normal rate and regular rhythm.   Respiratory: Effort normal and breath sounds normal.  GI: Soft. Bowel sounds are normal.  Genitourinary: Vagina normal and uterus normal.  Musculoskeletal: Normal range of motion.  Neurological: She is alert and oriented to person, place, and time.  Skin: Skin is warm and dry.  Psychiatric: She has a normal mood and affect. Her behavior is normal. Judgment and thought content normal.    Prenatal labs: ABO, Rh: --/--/O POS (10/01 4098) Antibody: NEG (10/01 0817) Rubella:   RPR: Non Reactive (08/25 0902)  HBsAg: Negative (02/07 0000)  HIV: Non Reactive (08/25 0902)  GBS:     Assessment/Plan: Preg @ 39.0wks [id number], who presents for IOL with A1DM. Admit to L&D. GBS neg. Will start cytotec and plan for foley bulb.    Cheryl Wolfe 11/25/2015, 10:17 AM

## 2015-11-26 ENCOUNTER — Encounter (HOSPITAL_COMMUNITY): Payer: Self-pay

## 2015-11-26 ENCOUNTER — Inpatient Hospital Stay (HOSPITAL_COMMUNITY): Payer: Medicaid Other | Admitting: Anesthesiology

## 2015-11-26 DIAGNOSIS — O24420 Gestational diabetes mellitus in childbirth, diet controlled: Secondary | ICD-10-CM

## 2015-11-26 DIAGNOSIS — Z3A39 39 weeks gestation of pregnancy: Secondary | ICD-10-CM

## 2015-11-26 LAB — RPR: RPR Ser Ql: NONREACTIVE

## 2015-11-26 LAB — GLUCOSE, CAPILLARY: Glucose-Capillary: 83 mg/dL (ref 65–99)

## 2015-11-26 MED ORDER — ONDANSETRON HCL 4 MG PO TABS: 4.0000 mg | ORAL_TABLET | ORAL | Status: DC | PRN

## 2015-11-26 MED ORDER — PHENYLEPHRINE 40 MCG/ML (10ML) SYRINGE FOR IV PUSH (FOR BLOOD PRESSURE SUPPORT)
PREFILLED_SYRINGE | INTRAVENOUS | Status: AC
  Filled 2015-11-26: qty 20

## 2015-11-26 MED ORDER — SIMETHICONE 80 MG PO CHEW: 80.0000 mg | CHEWABLE_TABLET | ORAL | Status: DC | PRN

## 2015-11-26 MED ORDER — EPHEDRINE 5 MG/ML INJ
10.0000 mg | INTRAVENOUS | Status: DC | PRN
  Filled 2015-11-26: qty 4

## 2015-11-26 MED ORDER — FENTANYL 2.5 MCG/ML BUPIVACAINE 1/10 % EPIDURAL INFUSION (WH - ANES)
INTRAMUSCULAR | Status: DC | PRN
  Administered 2015-11-26: 14 mL/h via EPIDURAL

## 2015-11-26 MED ORDER — DIPHENHYDRAMINE HCL 50 MG/ML IJ SOLN: 12.5000 mg | INTRAMUSCULAR | Status: DC | PRN

## 2015-11-26 MED ORDER — SODIUM CHLORIDE 0.9 % IV SOLN: 250.0000 mL | INTRAVENOUS | Status: DC | PRN

## 2015-11-26 MED ORDER — BENZOCAINE-MENTHOL 20-0.5 % EX AERO: 1.0000 "application " | INHALATION_SPRAY | CUTANEOUS | Status: DC | PRN

## 2015-11-26 MED ORDER — LACTATED RINGERS IV SOLN: 500.0000 mL | Freq: Once | INTRAVENOUS | Status: DC

## 2015-11-26 MED ORDER — SODIUM CHLORIDE 0.9% FLUSH: 3.0000 mL | Freq: Two times a day (BID) | INTRAVENOUS | Status: DC

## 2015-11-26 MED ORDER — ACETAMINOPHEN 325 MG PO TABS
650.0000 mg | ORAL_TABLET | ORAL | Status: DC | PRN
  Administered 2015-11-26 – 2015-11-27 (×2): 650 mg via ORAL
  Filled 2015-11-26 (×2): qty 2

## 2015-11-26 MED ORDER — SODIUM CHLORIDE 0.9% FLUSH: 3.0000 mL | INTRAVENOUS | Status: DC | PRN

## 2015-11-26 MED ORDER — PHENYLEPHRINE 40 MCG/ML (10ML) SYRINGE FOR IV PUSH (FOR BLOOD PRESSURE SUPPORT)
80.0000 ug | PREFILLED_SYRINGE | INTRAVENOUS | Status: DC | PRN
  Filled 2015-11-26: qty 5

## 2015-11-26 MED ORDER — ZOLPIDEM TARTRATE 5 MG PO TABS: 5.0000 mg | ORAL_TABLET | Freq: Every evening | ORAL | Status: DC | PRN

## 2015-11-26 MED ORDER — COCONUT OIL OIL
1.0000 "application " | TOPICAL_OIL | Status: DC | PRN
  Administered 2015-11-27: 1 via TOPICAL
  Filled 2015-11-26: qty 120

## 2015-11-26 MED ORDER — TETANUS-DIPHTH-ACELL PERTUSSIS 5-2.5-18.5 LF-MCG/0.5 IM SUSP
0.5000 mL | Freq: Once | INTRAMUSCULAR | Status: AC
  Administered 2015-11-26: 0.5 mL via INTRAMUSCULAR

## 2015-11-26 MED ORDER — MEASLES, MUMPS & RUBELLA VAC ~~LOC~~ INJ
0.5000 mL | INJECTION | Freq: Once | SUBCUTANEOUS | Status: DC
  Filled 2015-11-26: qty 0.5

## 2015-11-26 MED ORDER — LIDOCAINE HCL (PF) 1 % IJ SOLN
INTRAMUSCULAR | Status: DC | PRN
  Administered 2015-11-26: 6 mL via EPIDURAL
  Administered 2015-11-26: 4 mL via EPIDURAL

## 2015-11-26 MED ORDER — IBUPROFEN 600 MG PO TABS
600.0000 mg | ORAL_TABLET | Freq: Four times a day (QID) | ORAL | Status: DC
  Administered 2015-11-26 – 2015-11-27 (×6): 600 mg via ORAL
  Filled 2015-11-26 (×6): qty 1

## 2015-11-26 MED ORDER — FENTANYL 2.5 MCG/ML BUPIVACAINE 1/10 % EPIDURAL INFUSION (WH - ANES)
INTRAMUSCULAR | Status: AC
  Filled 2015-11-26: qty 125

## 2015-11-26 MED ORDER — SENNOSIDES-DOCUSATE SODIUM 8.6-50 MG PO TABS
2.0000 | ORAL_TABLET | ORAL | Status: DC
  Administered 2015-11-27: 2 via ORAL
  Filled 2015-11-26: qty 2

## 2015-11-26 MED ORDER — DIPHENHYDRAMINE HCL 25 MG PO CAPS: 25.0000 mg | ORAL_CAPSULE | Freq: Four times a day (QID) | ORAL | Status: DC | PRN

## 2015-11-26 MED ORDER — PRENATAL MULTIVITAMIN CH
1.0000 | ORAL_TABLET | Freq: Every day | ORAL | Status: DC
  Administered 2015-11-26 – 2015-11-27 (×2): 1 via ORAL
  Filled 2015-11-26 (×2): qty 1

## 2015-11-26 MED ORDER — ONDANSETRON HCL 4 MG/2ML IJ SOLN: 4.0000 mg | INTRAMUSCULAR | Status: DC | PRN

## 2015-11-26 NOTE — Anesthesia Procedure Notes (Signed)
Epidural Patient location during procedure: OB Start time: 11/26/2015 2:58 AM End time: 11/26/2015 3:08 AM  Staffing Anesthesiologist: Linton RumpALLAN, Gabrella Stroh DICKERSON Performed: anesthesiologist   Preanesthetic Checklist Completed: patient identified, surgical consent, pre-op evaluation, timeout performed, IV checked, risks and benefits discussed and monitors and equipment checked  Epidural Patient position: sitting Prep: site prepped and draped and DuraPrep Patient monitoring: continuous pulse ox and blood pressure Approach: midline Location: L2-L3 Injection technique: LOR air  Needle:  Needle type: Tuohy  Needle gauge: 17 G Needle length: 9 cm and 9 Needle insertion depth: 8 cm Catheter type: closed end flexible Catheter size: 19 Gauge Catheter at skin depth: 13 cm Test dose: negative  Assessment Events: blood not aspirated, injection not painful, no injection resistance, negative IV test and no paresthesia  Additional Notes Reason for block:procedure for pain

## 2015-11-26 NOTE — Progress Notes (Signed)
Patient ID: [name], female   DOB: 1985/07/03, 30 y.o.   MRN: [id number] S: Pt crying and upset about induction process. Pt states she feels like she has been a "[place]" since she has arrived. Pt states her other labors in the past have not lasted this long.  O: VSS, Afebrile, FHT's 130's, accels, no deccels noted; ctx's Q 2-4  Mins, palpate mod.; IOL for A1DM, CBG's 60's-110's today A: IOL for A1DM, stable maternal-fetal unit; SVE 5/70/-1, AROM with clear fluid, IUPC placed without difficulty P: Lengthy discussion by Marlynn Perking. Lawson, CNM about IOL process. Explained to pt and FOB that each pregn. And labor process is individualized.

## 2015-11-26 NOTE — Anesthesia Preprocedure Evaluation (Signed)
Anesthesia Evaluation  Patient identified by MRN, date of birth, ID band Patient awake    Reviewed: Allergy & Precautions, NPO status , Patient's Chart, lab work & pertinent test results  History of Anesthesia Complications Negative for: history of anesthetic complications  Airway Mallampati: II  TM Distance: >3 FB Neck ROM: Full    Dental  (+) Teeth Intact   Pulmonary former smoker,    Pulmonary exam normal breath sounds clear to auscultation       Cardiovascular negative cardio ROS   Rhythm:Regular Rate:Normal     Neuro/Psych negative neurological ROS     GI/Hepatic negative GI ROS, Neg liver ROS,   Endo/Other  diabetes, Gestational  Renal/GU negative Renal ROS     Musculoskeletal   Abdominal   Peds  Hematology negative hematology ROS (+)   Anesthesia Other Findings   Reproductive/Obstetrics (+) Pregnancy                             Anesthesia Physical Anesthesia Plan  ASA: II  Anesthesia Plan: Epidural   Post-op Pain Management:    Induction:   Airway Management Planned: Natural Airway  Additional Equipment:   Intra-op Plan:   Post-operative Plan:   Informed Consent: I have reviewed the patients History and Physical, chart, labs and discussed the procedure including the risks, benefits and alternatives for the proposed anesthesia with the patient or authorized representative who has indicated his/her understanding and acceptance.     Plan Discussed with:   Anesthesia Plan Comments: (I have discussed risks of neuraxial anesthesia including but not limited to infection, bleeding, nerve injury, back pain, headache, seizures, and failure of block. Patient denies bleeding disorders and is not currently anticoagulated. Labs have been reviewed. Risks and benefits discussed. All patient's questions answered.   Platelets 283)        Anesthesia Quick Evaluation

## 2015-11-26 NOTE — Anesthesia Postprocedure Evaluation (Signed)
Anesthesia Post Note  Patient: Cheryl Wolfe  Procedure(s) Performed: * No procedures listed *  Patient location during evaluation: Mother Baby Anesthesia Type: Epidural Level of consciousness: awake and alert and oriented Pain management: satisfactory to patient Vital Signs Assessment: post-procedure vital signs reviewed and stable Respiratory status: spontaneous breathing and nonlabored ventilation Cardiovascular status: stable Postop Assessment: no headache, no backache, no signs of nausea or vomiting, adequate PO intake and patient able to bend at knees (patient up walking) Anesthetic complications: no     Last Vitals:  Vitals:   11/26/15 0750 11/26/15 1131  BP: (!) 105/54 (!) 110/55  Pulse: 69 81  Resp: 18 18  Temp: 37.5 C 36.7 C    Last Pain:  Vitals:   11/26/15 1131  TempSrc: Axillary  PainSc:    Pain Goal:                 Lorilee Cafarella

## 2015-11-26 NOTE — Lactation Note (Signed)
This note was copied from a baby's chart. Lactation Consultation Note  Patient Name: Cheryl Wolfe ZOXWR'UToday's Date: 11/26/2015 Reason for consult: Initial assessment Breastfeeding consultation services and support information given and reviewed with mom.  This is mom's fourth baby and she breastfed previous babies but youngest is 28 years old.  Newborn is 6 hours old and has been latching but mom reports pain.  Baby is currently sleeping skin to skin.  Instructed to call out for feeding assist when baby starts to cue.  Mom worried baby has not had a wet diaper yet.  Reassured that this is normal and baby may take up to 24 hours to void.  Maternal Data Has patient been taught Hand Expression?: Yes Does the patient have breastfeeding experience prior to this delivery?: Yes  Feeding Feeding Type: Breast Fed Length of feed:  (infant sleepy)  LATCH Score/Interventions Latch: Repeated attempts needed to sustain latch, nipple held in mouth throughout feeding, stimulation needed to elicit sucking reflex. Intervention(s): Assist with latch;Adjust position  Audible Swallowing: A few with stimulation Intervention(s): Skin to skin  Type of Nipple: Everted at rest and after stimulation  Comfort (Breast/Nipple): Soft / non-tender     Hold (Positioning): Assistance needed to correctly position infant at breast and maintain latch.  LATCH Score: 7  Lactation Tools Discussed/Used     Consult Status Consult Status: Follow-up Date: 11/27/15 Follow-up type: In-patient    Huston FoleyMOULDEN, Cylah Fannin S 11/26/2015, 11:23 AM

## 2015-11-26 NOTE — Lactation Note (Signed)
This note was copied from a baby's chart. Lactation Consultation Note  Patient Name: Cheryl Blima DessertDiana Hernandez ONGEX'BToday's Date: 11/26/2015 Reason for consult: Follow-up assessment;Breast/nipple pain Mom states latches are painful.  She has been using cradle hold.  Assisted mom with cross cradle and correct techniques for deep latch.  Mom felt initial latch on pain but pain subsided and she was comfortable during feeding.  Baby has nice suck/swallow pattern.  Encouraged to call for assist prn.  Maternal Data Has patient been taught Hand Expression?: Yes Does the patient have breastfeeding experience prior to this delivery?: Yes  Feeding Feeding Type: Breast Fed Length of feed:  (infant sleepy)  LATCH Score/Interventions Latch: Grasps breast easily, tongue down, lips flanged, rhythmical sucking. Intervention(s): Adjust position;Assist with latch;Breast massage;Breast compression  Audible Swallowing: A few with stimulation Intervention(s): Skin to skin;Hand expression;Alternate breast massage  Type of Nipple: Everted at rest and after stimulation  Comfort (Breast/Nipple): Filling, red/small blisters or bruises, mild/mod discomfort     Hold (Positioning): Assistance needed to correctly position infant at breast and maintain latch. Intervention(s): Breastfeeding basics reviewed;Support Pillows;Position options;Skin to skin  LATCH Score: 7  Lactation Tools Discussed/Used     Consult Status Consult Status: Follow-up Date: 11/27/15 Follow-up type: In-patient    Huston FoleyMOULDEN, Shaneen Reeser S 11/26/2015, 2:08 PM

## 2015-11-27 ENCOUNTER — Ambulatory Visit (HOSPITAL_COMMUNITY): Payer: Self-pay

## 2015-11-27 DIAGNOSIS — O24420 Gestational diabetes mellitus in childbirth, diet controlled: Secondary | ICD-10-CM

## 2015-11-27 DIAGNOSIS — Z3A39 39 weeks gestation of pregnancy: Secondary | ICD-10-CM

## 2015-11-27 LAB — RUBELLA SCREEN: Rubella: 2.01 index (ref >0.99)

## 2015-11-27 MED ORDER — SENNOSIDES-DOCUSATE SODIUM 8.6-50 MG PO TABS: 2.0000 | ORAL_TABLET | ORAL | 1 refills | Status: DC

## 2015-11-27 MED ORDER — IBUPROFEN 600 MG PO TABS: 600.0000 mg | ORAL_TABLET | Freq: Four times a day (QID) | ORAL | 0 refills | Status: DC | PRN

## 2015-11-27 NOTE — Progress Notes (Signed)
Post Partum Day #1 Subjective: no complaints, up ad lib, voiding and tolerating PO  Objective: Blood pressure (!) 117/58, pulse 66, temperature 98.2 F (36.8 C), resp. rate 18, height 5\' 1"  (1.549 m), weight 196 lb (88.9 kg), last menstrual period 02/25/2015, SpO2 100 %, unknown if currently breastfeeding.  Physical Exam:  General: alert, cooperative and no distress Lochia: appropriate Uterine Fundus: firm Incision: none DVT Evaluation: No evidence of DVT seen on physical exam. No cords or calf tenderness. No significant calf/ankle edema.   Recent Labs  11/25/15 0817  HGB 13.5  HCT 38.6    Assessment/Plan: Plan for discharge tomorrow, Breastfeeding and Contraception planning Nuva Ring   LOS: 2 days   Ariez Neilan A Laurianne Floresca,CNM 11/27/2015, 8:39 AM

## 2015-11-27 NOTE — Discharge Instructions (Signed)

## 2015-11-27 NOTE — Discharge Summary (Signed)
OB Discharge Summary     Patient Name: Cheryl Wolfe DOB: 07-19-1985 MRN: [id number]  Date of admission: 11/25/2015 Delivering MD: Zerita Boers   Date of discharge: 11/27/2015  Admitting diagnosis: 39 wks induction Intrauterine pregnancy: [redacted]w[redacted]d     Secondary diagnosis:  Active Problems:   Pregnant and not yet delivered  Additional problems: A1DM     Discharge diagnosis: Term Pregnancy Delivered                                                                                                Post partum procedures:none  Augmentation: AROM and Pitocin  Complications: None  Hospital course:  Induction of Labor With Vaginal Delivery   30 y.o. yo [id number] at [redacted]w[redacted]d was admitted to the hospital 11/25/2015 for induction of labor.  Indication for induction: A1 DM.  Patient had an uncomplicated labor course as follows: Membrane Rupture Time/Date: 2:39 AM ,11/26/2015   Intrapartum Procedures: Episiotomy: None [1]                                         Lacerations:  None [1]  Patient had delivery of a Viable infant.  Information for the patient's newborn:  Chennel, Olivos [[id number]]  Delivery Method: Vaginal, Spontaneous Delivery (Filed from Delivery Summary)   11/26/2015  Details of delivery can be found in separate delivery note.  Patient had a routine postpartum course. Patient is discharged home 11/27/15.   Physical exam Vitals:   11/26/15 1131 11/26/15 1804 11/26/15 1930 11/27/15 0510  BP: (!) 110/55 111/64 96/61 (!) 117/58  Pulse: 81 73 75 66  Resp: 18 18 18 18   Temp: 98 F (36.7 C) 98 F (36.7 C) 98.1 F (36.7 C) 98.2 F (36.8 C)  TempSrc: Axillary Oral Oral   SpO2:      Weight:      Height:       General: alert, cooperative and no distress Lochia: appropriate Uterine Fundus: firm Incision: N/A DVT Evaluation: No evidence of DVT seen on physical exam. Labs: Lab Results  Component Value Date   WBC 12.8 (H) 11/25/2015   HGB 13.5 11/25/2015   HCT 38.6  11/25/2015   MCV 83.5 11/25/2015   PLT 283 11/25/2015   No flowsheet data found.  Discharge instruction: per After Visit Summary and "Baby and Me Booklet".  After visit meds:    Medication List    TAKE these medications   ibuprofen 600 MG tablet Commonly known as:  ADVIL,MOTRIN Take 1 tablet (600 mg total) by mouth every 6 (six) hours as needed for mild pain or moderate pain.   PREPLUS 27-1 MG Tabs Take 1 tablet by mouth daily.   senna-docusate 8.6-50 MG tablet Commonly known as:  Senokot-S Take 2 tablets by mouth daily. Start taking on:  11/28/2015       Diet: carb modified diet  Activity: Advance as tolerated. Pelvic rest for 6 weeks.   Outpatient follow up:6 weeks Follow up Appt:No future appointments. Follow up Visit:No Follow-up  on file.  Postpartum contraception: nuvaring  Newborn Data: Live born female  Birth Weight: 7 lb 7 oz (3374 g) APGAR: 9, 9  Baby Feeding: Breast Disposition:home with mother after circumcision   11/27/2015 Tillman SersAngela C Riccio, DO  OB FELLOW DISCHARGE ATTESTATION  I have seen and examined this patient and agree with above documentation in the resident's note.   Jen MowElizabeth Mumaw, DO OB Fellow

## 2015-11-27 NOTE — Lactation Note (Signed)
This note was copied from a baby's chart. Lactation Consultation Note  Patient Name: Cheryl Wolfe DessertDiana Wolfe ZOXWR'UToday's Date: 11/27/2015 Reason for consult: Follow-up assessment;Breast/nipple pain  Baby is 1431 hours old and has been to the breast consistently .  Mom reports she has been feeding the baby skin to skin , but the baby gets sleepy . LC observed to latches , with multiple swallows , increasing with breast compressions.  LC explained to mom since the baby has only stool x1 , his bellie is sluggish , and once he stools  More the feedings will pick up , also his sleep wake stage. @ this consult the baby woke up , rooting. Per mom right nipple is fine , the left at the base has a crack . LC assessed , did not notice crack .  Mom has been using her EBM and coconut oil . May be healed . When [employer] assisted with latch in football  Avoid area of concern per mom tender. LC adjusted baby's depth and hips in the football position and mom reported  Comfort. LC recommended feeding on the left breast in football/ having baby lie on is side so the skin area ( tender ) would  Not be feel pressure. Also with  Latch - compressions until swallows and comfort achieved and then intermittent .  Watch for non - nutritive feeding ( hanging out ). Pedis MD into see mom and plans to stay until tomorrow , this am was thinking of Going home early.    Maternal Data Has patient been taught Hand Expression?: Yes  Feeding Feeding Type: Breast Fed Length of feed: 18 min (LC noted multiply swallows , increased with breast compressi)  LATCH Score/Interventions Latch: Grasps breast easily, tongue down, lips flanged, rhythmical sucking. Intervention(s): Adjust position;Assist with latch;Breast compression;Breast massage  Audible Swallowing: Spontaneous and intermittent Intervention(s): Skin to skin;Hand expression  Type of Nipple: Everted at rest and after stimulation (swelling at the base of the nipple )  Comfort  (Breast/Nipple): Soft / non-tender Problem noted: Cracked, bleeding, blisters, bruises Intervention(s): Expressed breast milk to nipple;Other (comment) (shells & coconut oil)     Hold (Positioning): Assistance needed to correctly position infant at breast and maintain latch. Intervention(s): Breastfeeding basics reviewed;Support Pillows;Position options;Skin to skin  LATCH Score: 9  Lactation Tools Discussed/Used Tools: Shells [facility] ) Shell Type: Inverted   Consult Status Consult Status: Follow-up Date: 11/28/15 Follow-up type: In-patient    Cheryl SprangMargaret Ann Lessie Wolfe 11/27/2015, 11:54 AM

## 2015-11-29 ENCOUNTER — Inpatient Hospital Stay (HOSPITAL_COMMUNITY)
Admission: AD | Admit: 2015-11-29 | Discharge: 2015-11-29 | Disposition: A | Discharge status: Home / Self Care | Payer: Medicaid Other | Source: Ambulatory Visit | Attending: Obstetrics & Gynecology | Admitting: Obstetrics & Gynecology

## 2015-11-29 DIAGNOSIS — T881XXA Other complications following immunization, not elsewhere classified, initial encounter: Secondary | ICD-10-CM | POA: Diagnosis present

## 2015-11-29 DIAGNOSIS — T50Z95A Adverse effect of other vaccines and biological substances, initial encounter: Secondary | ICD-10-CM

## 2015-11-29 DIAGNOSIS — Z87891 Personal history of nicotine dependence: Secondary | ICD-10-CM | POA: Insufficient documentation

## 2015-11-29 NOTE — Discharge Instructions (Signed)
Take ibuprofen and benadryl as needed Ibuprofen 800mg  every 8 hours

## 2015-11-29 NOTE — MAU Provider Note (Signed)
  History     CSN: [id number]  Arrival date and time: 11/29/15 2053   None     Chief Complaint  Patient presents with  . Allergic Reaction   Cheryl Wolfe is 30 y.o. [id number] who had a MMR vaccine on 11/27/15. Yesterday she states that the area looked like a mosquito bite, and then got larger. She states that the area is itchy and feels hot. She took tylenol yesterday, but she has not taken anything today. She denies any fever. She states that she has been afraid to breastfeed since this started.    Allergic Reaction  This is a new problem. The current episode started yesterday. The problem occurs constantly. The problem has been gradually worsening since onset. Associated with: immunization  Incident location: hospital  Pertinent negatives include no vomiting. Swelling location: at injection site. Past treatments include acetaminophen. The treatment provided no relief.   Past Medical History:  Diagnosis Date  . Gestational diabetes     Past Surgical History:  Procedure Laterality Date  . COSMETIC SURGERY     Tummy Tuck     No family history on file.  Social History  Substance Use Topics  . Smoking status: Former Research scientist (life sciences)  . Smokeless tobacco: Never Used  . Alcohol use No    Allergies: No Known Allergies  Prescriptions Prior to Admission  Medication Sig Dispense Refill Last Dose  . ibuprofen (ADVIL,MOTRIN) 600 MG tablet Take 1 tablet (600 mg total) by mouth every 6 (six) hours as needed for mild pain or moderate pain. 30 tablet 0   . Prenatal Vit-Fe Fumarate-FA (PREPLUS) 27-1 MG TABS Take 1 tablet by mouth daily. 30 tablet 13 11/24/2015 at Unknown time  . senna-docusate (SENOKOT-S) 8.6-50 MG tablet Take 2 tablets by mouth daily. 30 tablet 1     Review of Systems  Constitutional: Negative for chills and fever.  Gastrointestinal: Negative for nausea and vomiting.  Neurological: Positive for dizziness.   Physical Exam   Blood pressure 123/81, pulse 70, temperature 98.4  F (36.9 C), temperature source Oral, resp. rate 16, last menstrual period 02/25/2015, SpO2 100 %, unknown if currently breastfeeding.  Physical Exam  Nursing note and vitals reviewed. Constitutional: She is oriented to person, place, and time. She appears well-developed and well-nourished. No distress.  HENT:  Head: Normocephalic.  Cardiovascular: Normal rate.   Respiratory: Effort normal.  Musculoskeletal: Normal range of motion.  9cmx8cm erythematous area on right arm  Area marked with a marker   Neurological: She is alert and oriented to person, place, and time.  Skin: Skin is warm and dry.  Psychiatric: She has a normal mood and affect.    MAU Course  Procedures  MDM   Assessment and Plan   1. Adverse effect of vaccine, initial encounter    DC home Comfort measures reviewed  Ice to area as needed Line drawn around the area, watch for worsening. Call MD if worsens OK to breastfeed RX: none, OTC ibuprofen and benadryl PRN  Return to MAU as needed FU with OB as planned  [place] .   Why:  call if it worsens  Contact information: [address] [place] [address] [phone]          Mathis Bud 11/29/2015, 10:08 PM

## 2015-11-29 NOTE — MAU Note (Signed)
Pt states she delivered 3 days ago. States that she got vaccine while here and now noticed a rash on arm today. States that itches and is red. Has a rash on back that also itches and burns.

## 2016-01-15 ENCOUNTER — Ambulatory Visit: Payer: Medicaid Other | Admitting: Obstetrics & Gynecology

## 2016-02-19 ENCOUNTER — Ambulatory Visit (INDEPENDENT_AMBULATORY_CARE_PROVIDER_SITE_OTHER): Payer: Medicaid Other | Admitting: Obstetrics and Gynecology

## 2016-02-19 ENCOUNTER — Encounter: Payer: Self-pay | Admitting: Obstetrics and Gynecology

## 2016-02-19 VITALS — BP 120/74 | HR 74 | Ht 61.0 in | Wt 188.0 lb

## 2016-02-19 DIAGNOSIS — Z30430 Encounter for insertion of intrauterine contraceptive device: Secondary | ICD-10-CM

## 2016-02-19 DIAGNOSIS — Z3202 Encounter for pregnancy test, result negative: Secondary | ICD-10-CM

## 2016-02-19 DIAGNOSIS — R8290 Unspecified abnormal findings in urine: Secondary | ICD-10-CM

## 2016-02-19 LAB — POCT URINALYSIS DIPSTICK
Bilirubin, UA: NEGATIVE
Glucose, UA: NEGATIVE
Ketones, UA: NEGATIVE
Leukocytes, UA: NEGATIVE
Nitrite, UA: NEGATIVE
PROTEIN UA: NEGATIVE
SPEC GRAV UA: 1.01
UROBILINOGEN UA: 0.2
pH, UA: 5

## 2016-02-19 LAB — POCT URINE PREGNANCY: Preg Test, Ur: NEGATIVE

## 2016-02-19 MED ORDER — LEVONORGESTREL 20 MCG/24HR IU IUD
INTRAUTERINE_SYSTEM | Freq: Once | INTRAUTERINE | Status: AC
  Administered 2016-02-19: 1 via INTRAUTERINE

## 2016-02-19 MED ORDER — LEVONORGESTREL 20 MCG/24HR IU IUD: 1.0000 | INTRAUTERINE_SYSTEM | Freq: Once | INTRAUTERINE | 0 refills | Status: DC

## 2016-02-19 NOTE — Addendum Note (Signed)
Addended by: Burnell BlanksMASHBURN, Aldina Porta on: 02/19/2016 04:59 PM   Modules accepted: Orders

## 2016-02-19 NOTE — Progress Notes (Signed)
Subjective:     Cheryl Wolfe is a 30 y.o. female who presents for a postpartum visit. She is 3 months postpartum following a spontaneous vaginal delivery. I have fully reviewed the prenatal and intrapartum course. The delivery was at 39 gestational weeks. Outcome: spontaneous vaginal delivery. Anesthesia: epidural. Postpartum course has been uncomplicated. Baby's course has been uncomplicated. Baby is feeding by bottle - Similac Advance. Bleeding no bleeding. Bowel function is normal. Bladder function is normal. Patient is sexually active. Contraception method is condoms. Postpartum depression screening: negative.     Review of Systems Pertinent items are noted in HPI.   Objective:    BP 120/74   Pulse 74   Ht 5\' 1"  (1.549 m)   Wt 188 lb (85.3 kg)   LMP 02/14/2016   Breastfeeding? No   BMI 35.52 kg/m   General:  alert, cooperative and no distress   Breasts:  inspection negative, no nipple discharge or bleeding, no masses or nodularity palpable  Lungs: clear to auscultation bilaterally  Heart:  regular rate and rhythm  Abdomen: soft, non-tender; bowel sounds normal; no masses,  no organomegaly   Vulva:  normal  Vagina: normal vagina, no discharge, exudate, lesion, or erythema  Cervix:  multiparous appearance  Corpus: normal size, contour, position, consistency, mobility, non-tender  Adnexa:  normal adnexa and no mass, fullness, tenderness  Rectal Exam: Not performed.        Assessment:     Normal postpartum exam. Pap smear not done at today's visit.   Plan:    1. Contraception: IUD  IUD Procedure Note Patient identified, informed consent performed, signed copy in chart, time out was performed.  Urine pregnancy test negative.  Speculum placed in the vagina.  Cervix visualized.  Cleaned with Betadine x 2.  Grasped anteriorly with a single tooth tenaculum.  Uterus sounded to 8 cm.  Mirena IUD placed per manufacturer's recommendations.  Strings trimmed to 3 cm. Tenaculum was  removed, good hemostasis noted.  Patient tolerated procedure well.   Patient given post procedure instructions and Mirena care card with expiration date.  Patient is asked to check IUD strings periodically and follow up in 4-6 weeks for IUD check.   2. Patient is medically cleared to resume all activities of daily living 3. Follow up in: 4 weeks for IUD check or as needed. Patient also needs to return for 2 hr glucola test secondary to GDM.

## 2016-02-19 NOTE — Progress Notes (Signed)
SVD 11/26/15. Pt wants 3 yr IUD today. IC 4 wks ago. LMP 02/15/16. Pt c/o odor in urine.

## 2016-02-21 ENCOUNTER — Telehealth: Payer: Self-pay | Admitting: *Deleted

## 2016-02-21 ENCOUNTER — Other Ambulatory Visit: Payer: Self-pay | Admitting: Obstetrics and Gynecology

## 2016-02-21 LAB — URINE CULTURE

## 2016-02-21 MED ORDER — CEPHALEXIN 500 MG PO CAPS: 500.0000 mg | ORAL_CAPSULE | Freq: Four times a day (QID) | ORAL | 0 refills | Status: DC

## 2016-02-21 NOTE — Telephone Encounter (Signed)
Call placed to pt to make aware of lab results, +UTI.  Made aware provider sent Rx.  Pt states she has pick up Rx.

## 2016-02-22 ENCOUNTER — Other Ambulatory Visit: Payer: Medicaid Other

## 2016-02-22 DIAGNOSIS — Z3490 Encounter for supervision of normal pregnancy, unspecified, unspecified trimester: Secondary | ICD-10-CM

## 2016-02-23 LAB — GLUCOSE TOLERANCE, 2 HOURS W/ 1HR
GLUCOSE, 1 HOUR: 106 mg/dL (ref 65–179)
GLUCOSE, 2 HOUR: 85 mg/dL (ref 65–152)
GLUCOSE, FASTING: 80 mg/dL (ref 65–91)

## 2016-03-20 ENCOUNTER — Ambulatory Visit: Payer: Medicaid Other | Admitting: Obstetrics and Gynecology

## 2016-05-20 ENCOUNTER — Emergency Department (HOSPITAL_COMMUNITY)
Admission: EM | Admit: 2016-05-20 | Discharge: 2016-05-20 | Disposition: A | Discharge status: Home / Self Care | Payer: BLUE CROSS/BLUE SHIELD | Attending: Emergency Medicine | Admitting: Emergency Medicine

## 2016-05-20 ENCOUNTER — Encounter (HOSPITAL_COMMUNITY): Payer: Self-pay

## 2016-05-20 DIAGNOSIS — Y929 Unspecified place or not applicable: Secondary | ICD-10-CM | POA: Diagnosis not present

## 2016-05-20 DIAGNOSIS — Z79899 Other long term (current) drug therapy: Secondary | ICD-10-CM | POA: Diagnosis not present

## 2016-05-20 DIAGNOSIS — S300XXA Contusion of lower back and pelvis, initial encounter: Secondary | ICD-10-CM

## 2016-05-20 DIAGNOSIS — W108XXA Fall (on) (from) other stairs and steps, initial encounter: Secondary | ICD-10-CM | POA: Diagnosis not present

## 2016-05-20 DIAGNOSIS — Z87891 Personal history of nicotine dependence: Secondary | ICD-10-CM | POA: Diagnosis not present

## 2016-05-20 DIAGNOSIS — Y999 Unspecified external cause status: Secondary | ICD-10-CM | POA: Diagnosis not present

## 2016-05-20 DIAGNOSIS — S39012A Strain of muscle, fascia and tendon of lower back, initial encounter: Secondary | ICD-10-CM | POA: Diagnosis not present

## 2016-05-20 DIAGNOSIS — Y939 Activity, unspecified: Secondary | ICD-10-CM | POA: Insufficient documentation

## 2016-05-20 DIAGNOSIS — H9201 Otalgia, right ear: Secondary | ICD-10-CM

## 2016-05-20 DIAGNOSIS — S3992XA Unspecified injury of lower back, initial encounter: Secondary | ICD-10-CM | POA: Diagnosis present

## 2016-05-20 MED ORDER — NAPROXEN 250 MG PO TABS
500.0000 mg | ORAL_TABLET | Freq: Once | ORAL | Status: AC
  Administered 2016-05-20: 500 mg via ORAL
  Filled 2016-05-20: qty 2

## 2016-05-20 MED ORDER — NAPROXEN 500 MG PO TABS: 500.0000 mg | ORAL_TABLET | Freq: Two times a day (BID) | ORAL | 0 refills | Status: DC

## 2016-05-20 MED ORDER — METHOCARBAMOL 500 MG PO TABS: 500.0000 mg | ORAL_TABLET | Freq: Four times a day (QID) | ORAL | 0 refills | Status: DC

## 2016-05-20 NOTE — ED Provider Notes (Signed)
[facility]-EMERGENCY DEPT Provider Note   CSN: [id number] Arrival date & time: 05/20/16  1052     History   Chief Complaint Chief Complaint  Patient presents with  . Fall  . Ear Pain    HPI Cheryl Wolfe is a 31 y.o. female.  Patient presents with acute onset of middle back pain and tailbone pain after slipping and falling down approximately 7 carpeted steps this morning. Patient describes soreness in her middle back and when she sits. No treatments prior to arrival. Patient denies warning symptoms of back pain including: fecal incontinence, urinary retention or overflow incontinence. She denies any threats to her safety or being pushed.   She also complains of right ear pain, on and off, over the past 1 week. No URI symptoms, fevers. Patient does not radiate. No treatments for this. This is a secondary complaint.      Past Medical History:  Diagnosis Date  . Gestational diabetes     Patient Active Problem List   Diagnosis Date Noted  . Pregnant and not yet delivered 11/25/2015  . Gestational diabetes 11/15/2015  . Supervision of normal pregnancy, antepartum 10/15/2015    Past Surgical History:  Procedure Laterality Date  . COSMETIC SURGERY     Tummy Tuck     OB History    Gravida Para Term Preterm AB Living   4 4 4     4    SAB TAB Ectopic Multiple Live Births         0 4       Home Medications    Prior to Admission medications   Medication Sig Start Date End Date Taking? Authorizing Provider  cephALEXin (KEFLEX) 500 MG capsule Take 1 capsule (500 mg total) by mouth 4 (four) times daily. 02/21/16   Peggy Constant, MD  ibuprofen (ADVIL,MOTRIN) 600 MG tablet Take 1 tablet (600 mg total) by mouth every 6 (six) hours as needed for mild pain or moderate pain. Patient not taking: Reported on 02/19/2016 11/27/15   Tillman Sers, DO  methocarbamol (ROBAXIN) 500 MG tablet Take 1 tablet (500 mg total) by mouth 4 (four) times daily. 05/20/16   Renne Crigler, PA-C    naproxen (NAPROSYN) 500 MG tablet Take 1 tablet (500 mg total) by mouth 2 (two) times daily. 05/20/16   Renne Crigler, PA-C  Prenatal Vit-Fe Fumarate-FA (PREPLUS) 27-1 MG TABS Take 1 tablet by mouth daily. Patient not taking: Reported on 02/19/2016 10/15/15   Hermina Staggers, MD  senna-docusate (SENOKOT-S) 8.6-50 MG tablet Take 2 tablets by mouth daily. Patient not taking: Reported on 02/19/2016 11/28/15   Tillman Sers, DO    Family History History reviewed. No pertinent family history.  Social History Social History  Substance Use Topics  . Smoking status: Former Games developer  . Smokeless tobacco: Never Used  . Alcohol use No     Allergies   Patient has no known allergies.   Review of Systems Review of Systems  Constitutional: Negative for fever and unexpected weight change.  HENT: Positive for ear pain. Negative for congestion, rhinorrhea and sore throat.   Eyes: Negative for redness.  Respiratory: Negative for cough.   Cardiovascular: Negative for chest pain.  Gastrointestinal: Negative for abdominal pain, constipation, diarrhea, nausea and vomiting.       Negative for fecal incontinence.   Genitourinary: Negative for dysuria, flank pain, hematuria, pelvic pain, vaginal bleeding and vaginal discharge.       Negative for urinary incontinence or retention.  Musculoskeletal: Positive for  back pain. Negative for myalgias.  Skin: Negative for rash.  Neurological: Negative for weakness, numbness and headaches.       Denies saddle paresthesias.     Physical Exam Updated Vital Signs BP 129/81 (BP Location: Left Arm)   Pulse 61   Temp 98.2 F (36.8 C)   Resp 18   Ht 5\' 1"  (1.549 m)   Wt 86.2 kg   SpO2 97%   Breastfeeding? No   BMI 35.90 kg/m   Physical Exam  Constitutional: She appears well-developed and well-nourished.  HENT:  Head: Normocephalic and atraumatic.  Right Ear: Hearing, tympanic membrane, external ear and ear canal normal. Tympanic membrane is not  perforated and not erythematous.  Eyes: Conjunctivae are normal.  Neck: Normal range of motion. Neck supple.  Pulmonary/Chest: Effort normal.  Abdominal: Soft. There is no tenderness. There is no CVA tenderness.  Musculoskeletal: Normal range of motion.       Cervical back: She exhibits normal range of motion, no tenderness and no bony tenderness.       Thoracic back: She exhibits tenderness (bilateral paraspinous). She exhibits normal range of motion and no bony tenderness.       Lumbar back: She exhibits tenderness (lower) and bony tenderness (coccygeal area). She exhibits normal range of motion.  No step-off noted with palpation of spine.   Neurological: She is alert. She has normal strength and normal reflexes. No sensory deficit.  5/5 strength in entire lower extremities bilaterally. No sensation deficit.   Skin: Skin is warm and dry. No rash noted.  Psychiatric: She has a normal mood and affect.  Nursing note and vitals reviewed.    ED Treatments / Results   Procedures Procedures (including critical care time)  Medications Ordered in ED Medications  naproxen (NAPROSYN) tablet 500 mg (not administered)     Initial Impression / Assessment and Plan / ED Course  I have reviewed the triage vital signs and the nursing notes.  Pertinent labs & imaging results that were available during my care of the patient were reviewed by me and considered in my medical decision making (see chart for details).     11:53 AM Patient seen and examined. Discussed utility of x-rays for coccygeal injury. Patient agrees to defer x-rays at this time.  Vital signs reviewed and are as follows: Vitals:   05/20/16 1105  BP: 129/81  Pulse: 61  Resp: 18  Temp: 98.2 F (36.8 C)    No red flag s/s of back pain. Patient was counseled on back pain precautions and told to do activity as tolerated but do not lift, push, or pull heavy objects more than 10 pounds for the next week.  Patient counseled to  use ice or heat on back for no longer than 15 minutes every hour.   Patient counseled on proper use of muscle relaxant medication. They were told not to drink alcohol, drive any vehicle, or do any dangerous activities while taking this medication. Patient verbalized understanding.  Patient urged to follow-up with PCP if pain does not improve with treatment and rest or if pain becomes recurrent. Urged to return with worsening severe pain, loss of bowel or bladder control, trouble walking.   Discussed NSAIDs, over-the-counter decongestant if desired for ear pain.  The patient verbalizes understanding and agrees with the plan.    Final Clinical Impressions(s) / ED Diagnoses   Final diagnoses:  Coccyx contusion, initial encounter  Back strain, initial encounter  Right ear pain   Fall,  coccygeal contusion, back strain: No red flags. Tenderness is mostly muscular. Patient may have broke her tailbone, however we discussed that treatment will still be supportive whether she has a fracture or just a bone bruise. Robaxin, NSAIDs, donut pillow advised.   Ear pain: Normal exam. Doubt referred pain from dentition. NSAIDs recommended with OTC decongestants if desired.  New Prescriptions New Prescriptions   METHOCARBAMOL (ROBAXIN) 500 MG TABLET    Take 1 tablet (500 mg total) by mouth 4 (four) times daily.   NAPROXEN (NAPROSYN) 500 MG TABLET    Take 1 tablet (500 mg total) by mouth 2 (two) times daily.     Renne Crigler, PA-C 05/20/16 1157    Alvira Monday, MD 05/24/16 [phone]

## 2016-05-20 NOTE — ED Triage Notes (Signed)
Pt presents with back and buttock pain after slipping at top of stairs and falling down 6-7 carpeted stairs.  -LOC;  Pt denies any bowel or bladder incontinence.   Pt reports landing on buttocks; able to ambulate.

## 2016-05-20 NOTE — Discharge Instructions (Signed)
Please read and follow all provided instructions.  Your diagnoses today include:  1. Coccyx contusion, initial encounter   2. Back strain, initial encounter   3. Right ear pain     Tests performed today include:  Vital signs - see below for your results today  Medications prescribed:   Naproxen - anti-inflammatory pain medication  Do not exceed 500mg  naproxen every 12 hours, take with food  You have been prescribed an anti-inflammatory medication or NSAID. Take with food. Take smallest effective dose for the shortest duration needed for your pain. Stop taking if you experience stomach pain or vomiting.    Robaxin (methocarbamol) - muscle relaxer medication  DO NOT drive or perform any activities that require you to be awake and alert because this medicine can make you drowsy.   Take any prescribed medications only as directed.  Home care instructions:   Follow any educational materials contained in this packet  Please rest, use ice or heat on your back for the next several days  Do not lift, push, pull anything more than 10 pounds for the next week  Consider using a donut pillow if needed for comfort -- you may find these at a pharmacy  Follow-up instructions: Please follow-up with your primary care provider in the next 1 week for further evaluation of your symptoms.   Return instructions:  SEEK IMMEDIATE MEDICAL ATTENTION IF YOU HAVE:  New numbness, tingling, weakness, or problem with the use of your arms or legs  Severe back pain not relieved with medications  Loss control of your bowels or bladder  Increasing pain in any areas of the body (such as chest or abdominal pain)  Shortness of breath, dizziness, or fainting.   Worsening nausea (feeling sick to your stomach), vomiting, fever, or sweats  Any other emergent concerns regarding your health   Additional Information:  Your vital signs today were: BP 129/81 (BP Location: Left Arm)    Pulse 61    Temp  98.2 F (36.8 C)    Resp 18    Ht 5\' 1"  (1.549 m)    Wt 86.2 kg    SpO2 97%    Breastfeeding? No    BMI 35.90 kg/m  If your blood pressure (BP) was elevated above 135/85 this visit, please have this repeated by your doctor within one month. --------------

## 2016-05-20 NOTE — ED Notes (Signed)
States fell down 7 steps this am, denies being pushed or threatened c/o lower back pain and upper shoulder pain, also c/o rt ear pain, did not hit ear

## 2017-01-29 ENCOUNTER — Other Ambulatory Visit: Payer: Self-pay

## 2017-01-29 ENCOUNTER — Other Ambulatory Visit (HOSPITAL_COMMUNITY)
Admission: RE | Admit: 2017-01-29 | Discharge: 2017-01-29 | Disposition: A | Discharge status: Home / Self Care | Payer: Medicaid Other | Source: Ambulatory Visit | Attending: Obstetrics | Admitting: Obstetrics

## 2017-01-29 ENCOUNTER — Encounter: Payer: Self-pay | Admitting: Obstetrics

## 2017-01-29 ENCOUNTER — Ambulatory Visit: Payer: Medicaid Other | Admitting: Obstetrics

## 2017-01-29 VITALS — BP 125/71 | HR 77 | Ht 61.0 in | Wt 184.0 lb

## 2017-01-29 DIAGNOSIS — E669 Obesity, unspecified: Secondary | ICD-10-CM

## 2017-01-29 DIAGNOSIS — N898 Other specified noninflammatory disorders of vagina: Secondary | ICD-10-CM

## 2017-01-29 DIAGNOSIS — Z0000 Encounter for general adult medical examination without abnormal findings: Secondary | ICD-10-CM

## 2017-01-29 DIAGNOSIS — Z30431 Encounter for routine checking of intrauterine contraceptive device: Secondary | ICD-10-CM

## 2017-01-29 DIAGNOSIS — Z01419 Encounter for gynecological examination (general) (routine) without abnormal findings: Secondary | ICD-10-CM | POA: Insufficient documentation

## 2017-01-29 DIAGNOSIS — N944 Primary dysmenorrhea: Secondary | ICD-10-CM

## 2017-01-29 DIAGNOSIS — R8290 Unspecified abnormal findings in urine: Secondary | ICD-10-CM

## 2017-01-29 DIAGNOSIS — L680 Hirsutism: Secondary | ICD-10-CM

## 2017-01-29 DIAGNOSIS — Z113 Encounter for screening for infections with a predominantly sexual mode of transmission: Secondary | ICD-10-CM

## 2017-01-29 MED ORDER — IBUPROFEN 800 MG PO TABS: 800.0000 mg | ORAL_TABLET | Freq: Three times a day (TID) | ORAL | 5 refills | Status: DC | PRN

## 2017-01-29 NOTE — Progress Notes (Signed)
Subjective:        Cheryl Wolfe is a 31 y.o. female here for a routine exam.  Current complaints: Malodorous urine, concentrated.  Lower abdominal pain for the past several weeks.  Malodorous discharge..  Denies dysuria.  Has had a Mirena IUD in for a year.  Not pleased with the irregular bleeding.  Personal health questionnaire:  Is patient [name], have a family history of breast and/or ovarian cancer: no Is there a family history of uterine cancer diagnosed at age < 5750, gastrointestinal cancer, urinary tract cancer, family member who is a Personnel officerLynch syndrome-associated carrier: no Is the patient overweight and hypertensive, family history of diabetes, personal history of gestational diabetes, preeclampsia or PCOS: no Is patient over 3155, have PCOS,  family history of premature CHD under age 29, diabetes, smoke, have hypertension or peripheral artery disease:  no At any time, has a partner hit, kicked or otherwise hurt or frightened you?: no Over the past 2 weeks, have you felt down, depressed or hopeless?: no Over the past 2 weeks, have you felt little interest or pleasure in doing things?:no   Gynecologic History Patient's last menstrual period was 01/21/2017 (exact date). Contraception: IUD Last Pap: 2017. Results were: normal Last mammogram: n/a. Results were: n/a  Obstetric History OB History  Gravida Para Term Preterm AB Living  4 4 4     4   SAB TAB Ectopic Multiple Live Births        0 4    # Outcome Date GA Lbr Len/2nd Weight Sex Delivery Anes PTL Lv  4 Term 11/26/15 [id number] / 00:10 7 lb 7 oz (3.374 kg) M Vag-Spont EPI  LIV  3 Term      Vag-Spont   LIV  2 Term      Vag-Spont   LIV  1 Term      Vag-Spont   LIV      Past Medical History:  Diagnosis Date  . Gestational diabetes     Past Surgical History:  Procedure Laterality Date  . COSMETIC SURGERY     Tummy Tuck      Current Outpatient Medications:  .  cephALEXin (KEFLEX) 500 MG capsule, Take  1 capsule (500 mg total) by mouth 4 (four) times daily. (Patient not taking: Reported on 01/29/2017), Disp: 28 capsule, Rfl: 0 .  ibuprofen (ADVIL,MOTRIN) 600 MG tablet, Take 1 tablet (600 mg total) by mouth every 6 (six) hours as needed for mild pain or moderate pain. (Patient not taking: Reported on 02/19/2016), Disp: 30 tablet, Rfl: 0 .  methocarbamol (ROBAXIN) 500 MG tablet, Take 1 tablet (500 mg total) by mouth 4 (four) times daily. (Patient not taking: Reported on 01/29/2017), Disp: 20 tablet, Rfl: 0 .  naproxen (NAPROSYN) 500 MG tablet, Take 1 tablet (500 mg total) by mouth 2 (two) times daily. (Patient not taking: Reported on 01/29/2017), Disp: 20 tablet, Rfl: 0 .  Prenatal Vit-Fe Fumarate-FA (PREPLUS) 27-1 MG TABS, Take 1 tablet by mouth daily. (Patient not taking: Reported on 02/19/2016), Disp: 30 tablet, Rfl: 13 .  senna-docusate (SENOKOT-S) 8.6-50 MG tablet, Take 2 tablets by mouth daily. (Patient not taking: Reported on 02/19/2016), Disp: 30 tablet, Rfl: 1 No Known Allergies  Social History   Tobacco Use  . Smoking status: Former Games developermoker  . Smokeless tobacco: Never Used  Substance Use Topics  . Alcohol use: No    History reviewed. No pertinent family history.    Review of Systems  Constitutional: negative for fatigue  and weight loss Respiratory: negative for cough and wheezing Cardiovascular: negative for chest pain, fatigue and palpitations Gastrointestinal: negative for abdominal pain and change in bowel habits Musculoskeletal:negative for myalgias Neurological: negative for gait problems and tremors Behavioral/Psych: negative for abusive relationship, depression Endocrine: negative for temperature intolerance    Genitourinary:negative for abnormal menstrual periods, genital lesions, hot flashes, sexual problems and vaginal discharge Integument/breast: negative for breast lump, breast tenderness, nipple discharge and skin lesion(s)    Objective:       BP 125/71   Pulse  77   Ht 5\' 1"  (1.549 m)   Wt 184 lb (83.5 kg)   LMP 01/21/2017 (Exact Date)   Breastfeeding? No   BMI 34.77 kg/m  General:   alert  Skin:   no rash or abnormalities  Lungs:   clear to auscultation bilaterally  Heart:   regular rate and rhythm, S1, S2 normal, no murmur, click, rub or gallop  Breasts:   normal without suspicious masses, skin or nipple changes or axillary nodes  Abdomen:  normal findings: no organomegaly, soft, non-tender and no hernia  Pelvis:  External genitalia: normal general appearance Urinary system: urethral meatus normal and bladder without fullness, nontender Vaginal: normal without tenderness, induration or masses Cervix: normal appearance.  IUD string visible. Adnexa: normal bimanual exam Uterus: anteverted and non-tender, normal size   Lab Review Urine pregnancy test Labs reviewed yes Radiologic studies reviewed no  50% of 20 min visit spent on counseling and coordination of care.   Assessment:     1. Encounter for routine gynecological examination with Papanicolaou smear of cervix Rx: - Cytology - PAP - Urine Culture - Comprehensive metabolic panel - Triglycerides - HDL cholesterol - Cholesterol, total - Hemoglobin A1c  2. Encounter for routine checking of intrauterine contraceptive device (IUD)   3. Abnormal urine odor Rx: - Urine Culture  4. Vaginal discharge Rx: - Cervicovaginal ancillary only  5. Hirsutism Rx: - Testosterone, Free, Total, SHBG - US PELVIC COMPLETE WITH TRANSVAGINAL; Future  6. Obesity (BMI 35.0-39.9 without comorbidity) Rx: - Ambulatory referral to Internal Medicine - TSH  7. Screening examination for STD (sexually transmitted disease) Rx: - HIV antibody - Hepatitis B surface antigen - RPR - Hepatitis C antibody  8. Primary dysmenorrhea - Ibuprofen Rx with instructions  For treatment of monthly dysmenorrhea    Plan:    Education reviewed: calcium supplements, depression evaluation, low fat, low  cholesterol diet, safe sex/STD prevention, self breast exams and weight bearing exercise. Contraception: IUD. Follow up in: 1 year.   Meds ordered this encounter  Medications  . DISCONTD: ibuprofen (ADVIL,MOTRIN) 800 MG tablet    Sig: Take 1 tablet (800 mg total) by mouth every 8 (eight) hours as needed.    Dispense:  30 tablet    Refill:  5   Orders Placed This Encounter  Procedures  . Urine Culture  . Urine Culture  . US PELVIC COMPLETE WITH TRANSVAGINAL    Standing Status:   Future    Standing Expiration Date:   04/01/2018    Order Specific Question:   Reason for Exam (SYMPTOM  OR DIAGNOSIS REQUIRED)    Answer:   Pelvic pain.  Hirsutism - R/O PCOS    Order Specific Question:   Preferred imaging location?    Answer:   [facility]  . Comprehensive metabolic panel  . TSH  . Triglycerides  . HDL cholesterol  . Cholesterol, total  . Hemoglobin A1c  . HIV antibody  . Hepatitis  B surface antigen  . RPR  . Hepatitis C antibody  . Testosterone, Free, Total, SHBG  . Ambulatory referral to Internal Medicine    Referral Priority:   Routine    Referral Type:   Consultation    Referral Reason:   Specialty Services Required    Requested Specialty:   Internal Medicine    Number of Visits Requested:   1

## 2017-01-29 NOTE — Patient Instructions (Addendum)
Hirsutism and Masculinization Hirsutism is when a female has an excessive amount of hair in an area where a female would typically have hair growth, such as the face, chest, or back. Masculinization is when a female's body develops like a female's body. This may include a deepening voice, loss of breast tissue, increased muscle mass, thinning scalp hair (balding), and clitoris growth. Many things can cause these conditions. The most common cause is polycystic ovary syndrome (PCOS). Your health care provider may do blood tests and imaging studies to find the cause. Follow these instructions at home:  Take over-the-counter and prescription medicines only as told by your health care provider.  Lose weight if you are overweight, and maintain a healthy weight.  Ask your health care provider for recommendations for hair removal. These may include temporary measures like shaving, creams, and waxing. More permanent ways to remove hair include electrolysis and laser treatments.  Keep all follow-up visits as told by your health care provider. This is important. Contact a health care provider if:  Your symptoms suddenly get worse.  You develop acne.  You have irregular menstrual periods.  You stop having your period.  You feel a lump in your lower abdomen. This information is not intended to replace advice given to you by your health care provider. Make sure you discuss any questions you have with your health care provider. Document Released: 04/21/2001 Document Revised: 07/19/2015 Document Reviewed: 10/31/2014 Elsevier Interactive Patient Education  2018 [employer]  Intrauterine Device Information An intrauterine device (IUD) is inserted into your uterus to prevent pregnancy. There are two types of IUDs available:  Copper IUD-This type of IUD is wrapped in copper wire and is placed inside the uterus. Copper makes the uterus and fallopian tubes produce a fluid that kills sperm. The copper IUD can  stay in place for 10 years.  Hormone IUD-This type of IUD contains the hormone progestin (synthetic progesterone). The hormone thickens the cervical mucus and prevents sperm from entering the uterus. It also thins the uterine lining to prevent implantation of a fertilized egg. The hormone can weaken or kill the sperm that get into the uterus. One type of hormone IUD can stay in place for 5 years, and another type can stay in place for 3 years.  Your health care provider will make sure you are a good candidate for a contraceptive IUD. Discuss with your health care provider the possible side effects. Advantages of an intrauterine device  IUDs are highly effective, reversible, long acting, and low maintenance.  There are no estrogen-related side effects.  An IUD can be used when breastfeeding.  IUDs are not associated with weight gain.  The copper IUD works immediately after insertion.  The hormone IUD works right away if inserted within 7 days of your period starting. You will need to use a backup method of birth control for 7 days if the hormone IUD is inserted at any other time in your cycle.  The copper IUD does not interfere with your female hormones.  The hormone IUD can make heavy menstrual periods lighter and decrease cramping.  The hormone IUD can be used for 3 or 5 years.  The copper IUD can be used for 10 years. Disadvantages of an intrauterine device  The hormone IUD can be associated with irregular bleeding patterns.  The copper IUD can make your menstrual flow heavier and more painful.  You may experience cramping and vaginal bleeding after insertion. This information is not intended to replace  advice given to you by your health care provider. Make sure you discuss any questions you have with your health care provider. Document Released: 01/15/2004 Document Revised: 07/19/2015 Document Reviewed: 08/01/2012 Elsevier Interactive Patient Education  2017 [employer]

## 2017-01-29 NOTE — Progress Notes (Signed)
Presents for AEX, complains of dark malodorous urine x 4 weeks.  Lower abdominal pain radiating towards her back 5/10.  White, malodorous discharge x 4 weeks.  Denies NV, fever, itching.

## 2017-01-30 LAB — CERVICOVAGINAL ANCILLARY ONLY
BACTERIAL VAGINITIS: NEGATIVE
CANDIDA VAGINITIS: NEGATIVE
Chlamydia: NEGATIVE
Neisseria Gonorrhea: NEGATIVE
Trichomonas: NEGATIVE

## 2017-01-30 LAB — CYTOLOGY - PAP: DIAGNOSIS: NEGATIVE

## 2017-01-31 LAB — URINE CULTURE

## 2017-02-02 ENCOUNTER — Other Ambulatory Visit: Payer: Self-pay | Admitting: Obstetrics

## 2017-02-04 LAB — TESTOSTERONE, FREE, TOTAL, SHBG
SEX HORMONE BINDING: 42.6 nmol/L (ref 24.6–122.0)
TESTOSTERONE FREE: 1.7 pg/mL (ref 0.0–4.2)
Testosterone: 33 ng/dL (ref 8–48)

## 2017-02-04 LAB — HIV ANTIBODY (ROUTINE TESTING W REFLEX): HIV Screen 4th Generation wRfx: NONREACTIVE

## 2017-02-04 LAB — COMPREHENSIVE METABOLIC PANEL
ALK PHOS: 89 IU/L (ref 39–117)
ALT: 52 IU/L — AB (ref 0–32)
AST: 33 IU/L (ref 0–40)
Albumin/Globulin Ratio: 1.4 (ref 1.2–2.2)
Albumin: 4.6 g/dL (ref 3.5–5.5)
BUN/Creatinine Ratio: 21 (ref 9–23)
BUN: 15 mg/dL (ref 6–20)
Bilirubin Total: 0.3 mg/dL (ref 0.0–1.2)
CALCIUM: 9.6 mg/dL (ref 8.7–10.2)
CO2: 21 mmol/L (ref 20–29)
CREATININE: 0.7 mg/dL (ref 0.57–1.00)
Chloride: 101 mmol/L (ref 96–106)
GFR calc Af Amer: 135 mL/min/{1.73_m2} (ref >59)
GFR, EST NON AFRICAN AMERICAN: 117 mL/min/{1.73_m2} (ref >59)
GLUCOSE: 79 mg/dL (ref 65–99)
Globulin, Total: 3.2 g/dL (ref 1.5–4.5)
Potassium: 4.4 mmol/L (ref 3.5–5.2)
Sodium: 140 mmol/L (ref 134–144)
Total Protein: 7.8 g/dL (ref 6.0–8.5)

## 2017-02-04 LAB — RPR: RPR: NONREACTIVE

## 2017-02-04 LAB — HEPATITIS C ANTIBODY: Hep C Virus Ab: <0.1 s/co ratio (ref 0.0–0.9)

## 2017-02-04 LAB — TSH: TSH: 1.32 u[IU]/mL (ref 0.450–4.500)

## 2017-02-04 LAB — HEMOGLOBIN A1C
Est. average glucose Bld gHb Est-mCnc: 108 mg/dL
HEMOGLOBIN A1C: 5.4 % (ref 4.8–5.6)

## 2017-02-04 LAB — TRIGLYCERIDES: TRIGLYCERIDES: 136 mg/dL (ref 0–149)

## 2017-02-04 LAB — HDL CHOLESTEROL: HDL: 46 mg/dL (ref >39)

## 2017-02-04 LAB — CHOLESTEROL, TOTAL: CHOLESTEROL TOTAL: 201 mg/dL — AB (ref 100–199)

## 2017-02-04 LAB — HEPATITIS B SURFACE ANTIGEN: Hepatitis B Surface Ag: NEGATIVE

## 2017-02-05 ENCOUNTER — Ambulatory Visit (HOSPITAL_COMMUNITY): Payer: Medicaid Other

## 2017-02-09 ENCOUNTER — Ambulatory Visit (HOSPITAL_COMMUNITY)
Admission: RE | Admit: 2017-02-09 | Discharge: 2017-02-09 | Disposition: A | Discharge status: Home / Self Care | Payer: Medicaid Other | Source: Ambulatory Visit | Attending: Obstetrics | Admitting: Obstetrics

## 2017-02-09 DIAGNOSIS — Z975 Presence of (intrauterine) contraceptive device: Secondary | ICD-10-CM | POA: Insufficient documentation

## 2017-02-09 DIAGNOSIS — L680 Hirsutism: Secondary | ICD-10-CM | POA: Insufficient documentation

## 2017-02-09 DIAGNOSIS — N852 Hypertrophy of uterus: Secondary | ICD-10-CM | POA: Diagnosis not present

## 2017-02-10 ENCOUNTER — Telehealth: Payer: Self-pay | Admitting: Pediatrics

## 2017-02-10 NOTE — Telephone Encounter (Signed)
Pt called office stating she was recently dx'd with UTI and no ATB has been sent to pharmacy.  She states she has not recently taken any ATBs and would like one sent today since she was diagnosed last week.  C&S in lab results.

## 2017-02-11 ENCOUNTER — Other Ambulatory Visit: Payer: Self-pay | Admitting: Obstetrics

## 2017-02-11 NOTE — Telephone Encounter (Signed)
[facility]lex [facility] [address] on 01/29/17.

## 2017-02-11 NOTE — Telephone Encounter (Signed)
This Keflex rx was from 2017, pt denied taking it on 01/29/17.  Patient has not recently had ATB.

## 2017-02-12 ENCOUNTER — Ambulatory Visit (INDEPENDENT_AMBULATORY_CARE_PROVIDER_SITE_OTHER): Payer: Medicaid Other | Admitting: Obstetrics

## 2017-02-12 ENCOUNTER — Encounter: Payer: Self-pay | Admitting: Obstetrics

## 2017-02-12 VITALS — BP 110/74 | HR 79 | Wt 188.0 lb

## 2017-02-12 DIAGNOSIS — N3000 Acute cystitis without hematuria: Secondary | ICD-10-CM | POA: Diagnosis not present

## 2017-02-12 DIAGNOSIS — Z30431 Encounter for routine checking of intrauterine contraceptive device: Secondary | ICD-10-CM

## 2017-02-12 NOTE — Telephone Encounter (Signed)
I attempted to contact patient via phone call.  Both numbers in computer could not be completed as dialed. I will attempt to contact later.

## 2017-02-12 NOTE — Telephone Encounter (Signed)
Keflex Rx.  She may pick it up from pharmacy.

## 2017-02-12 NOTE — Telephone Encounter (Signed)
Pt in office today for visit

## 2017-02-12 NOTE — Progress Notes (Signed)
Subjective:    Cheryl Wolfe is a 31 y.o. female who presents for contraception counseling. The patient has a Mirena IUD in place.  Has had irregular periods and weight gain with the Mirena IUD over the past year.  She was seen a few weeks ago and exam was normal.  Ultrasound ordered and showed normal intrauterine position of IUD.  She would like the IUD removed and replaced with a non-hormonal IUD.  The patient is sexually active. Pertinent past medical history: urinary tract infections.  The information documented in the HPI was reviewed and verified.  Menstrual History: OB History    Gravida Para Term Preterm AB Living   4 4 4     4    SAB TAB Ectopic Multiple Live Births         0 4       Patient's last menstrual period was 01/21/2017 (exact date).   Patient Active Problem List   Diagnosis Date Noted  . Pregnant and not yet delivered 11/25/2015  . Gestational diabetes 11/15/2015  . Supervision of normal pregnancy, antepartum 10/15/2015   Past Medical History:  Diagnosis Date  . Gestational diabetes     Past Surgical History:  Procedure Laterality Date  . COSMETIC SURGERY     Tummy Tuck      Current Outpatient Medications:  .  cephALEXin (KEFLEX) 500 MG capsule, Take 1 capsule (500 mg total) by mouth 4 (four) times daily. (Patient not taking: Reported on 01/29/2017), Disp: 28 capsule, Rfl: 0 .  ibuprofen (ADVIL,MOTRIN) 800 MG tablet, Take 1 tablet (800 mg total) by mouth every 8 (eight) hours as needed., Disp: 30 tablet, Rfl: 5 .  methocarbamol (ROBAXIN) 500 MG tablet, Take 1 tablet (500 mg total) by mouth 4 (four) times daily. (Patient not taking: Reported on 01/29/2017), Disp: 20 tablet, Rfl: 0 .  naproxen (NAPROSYN) 500 MG tablet, Take 1 tablet (500 mg total) by mouth 2 (two) times daily. (Patient not taking: Reported on 01/29/2017), Disp: 20 tablet, Rfl: 0 .  Prenatal Vit-Fe Fumarate-FA (PREPLUS) 27-1 MG TABS, Take 1 tablet by mouth daily. (Patient not taking:  Reported on 02/19/2016), Disp: 30 tablet, Rfl: 13 .  senna-docusate (SENOKOT-S) 8.6-50 MG tablet, Take 2 tablets by mouth daily. (Patient not taking: Reported on 02/19/2016), Disp: 30 tablet, Rfl: 1 No Known Allergies  Social History   Tobacco Use  . Smoking status: Former Games developermoker  . Smokeless tobacco: Never Used  Substance Use Topics  . Alcohol use: No    History reviewed. No pertinent family history.     Review of Systems Constitutional: positive for weight gain Genitourinary:positive for abnormal menstrual periods    Objective:   BP 110/74   Pulse 79   Wt 188 lb (85.3 kg)   LMP 01/21/2017 (Exact Date)   BMI 35.52 kg/m    PE:  Deferred   Lab Review Urine pregnancy test Labs reviewed yes Radiologic studies reviewed yes  >50% of 15 min visit spent on counseling and coordination of care.    Assessment:    [age] y.o., discontinuing Mirena  IUD, and inserting a non-hormonal IUD no contraindications.   Plan:   Will schedule removal of Mirena IUD and insertion of ParaGuard IUD in 2 weeks.  All questions answered. Contraception: IUD. Agricultural engineerducational material distributed. Follow up in 2 weeks. No orders of the defined types were placed in this encounter.  No orders of the defined types were placed in this encounter.

## 2017-03-20 DIAGNOSIS — L680 Hirsutism: Secondary | ICD-10-CM | POA: Insufficient documentation

## 2017-03-26 ENCOUNTER — Encounter: Payer: Self-pay | Admitting: Obstetrics

## 2017-03-26 ENCOUNTER — Encounter: Payer: Self-pay | Admitting: *Deleted

## 2017-03-26 ENCOUNTER — Ambulatory Visit (INDEPENDENT_AMBULATORY_CARE_PROVIDER_SITE_OTHER): Payer: Medicaid Other | Admitting: Obstetrics

## 2017-03-26 VITALS — BP 114/77 | HR 72 | Wt 184.0 lb

## 2017-03-26 DIAGNOSIS — Z3202 Encounter for pregnancy test, result negative: Secondary | ICD-10-CM

## 2017-03-26 DIAGNOSIS — Z30433 Encounter for removal and reinsertion of intrauterine contraceptive device: Secondary | ICD-10-CM | POA: Diagnosis not present

## 2017-03-26 DIAGNOSIS — R300 Dysuria: Secondary | ICD-10-CM | POA: Diagnosis not present

## 2017-03-26 LAB — POCT URINALYSIS DIPSTICK
BILIRUBIN UA: NEGATIVE
Glucose, UA: NEGATIVE
Ketones, UA: NEGATIVE
Nitrite, UA: POSITIVE
Spec Grav, UA: 1.025 (ref 1.010–1.025)
Urobilinogen, UA: 0.2 E.U./dL
pH, UA: 6 (ref 5.0–8.0)

## 2017-03-26 LAB — POCT URINE PREGNANCY: Preg Test, Ur: NEGATIVE

## 2017-03-26 MED ORDER — PARAGARD INTRAUTERINE COPPER IU IUD
1.0000 | INTRAUTERINE_SYSTEM | Freq: Once | INTRAUTERINE | Status: AC
  Administered 2017-03-26: 1 via INTRAUTERINE

## 2017-03-26 NOTE — Progress Notes (Signed)
Patient presents for IUD Removal/IUD Insertion today. Pt currently has Mirena IUD inserted : 12/26/017. Pt thinks she may have a UTI. Pt advised to leave a sample.  ZOX:[id number]:unsure  Periods are irregular

## 2017-03-26 NOTE — Progress Notes (Addendum)
    GYNECOLOGY OFFICE PROCEDURE NOTE  Cheryl Wolfe is a 32 y.o. [phone] here for MIRENA IUD removal and reinsertion of PARAGUARD IUD. No GYN concerns.  Last pap smear was irregular with IUD.  IUD Removal and Reinsertion  Patient identified, informed consent performed, consent signed.   Discussed risks of irregular bleeding, cramping, infection, malpositioning or misplacement of the IUD outside the uterus which may require further procedures. Also advised to use backup contraception for one week as the risk of pregnancy is higher during the transition period of removing an IUD and replacing it with another one. Time out was performed. Speculum placed in the vagina. The strings of the IUD were grasped and pulled using ring forceps. The IUD was successfully removed in its entirety. The cervix was cleaned with Betadine x 2 and grasped anteriorly with a single tooth tenaculum.  The new ParaGuard IUD insertion apparatus was used to sound the uterus to 8 cm;  the IUD was then placed per manufacturer's recommendations. Strings trimmed to 3 cm. Tenaculum was removed, good hemostasis noted. Patient tolerated procedure well.   Patient was given post-procedure instructions.  She was reminded to have backup contraception for one week during this transition period between IUDs.  Patient was also asked to check IUD strings periodically and follow up in 4 weeks for IUD check.  ParaGuard IUD Lot # [id number] Exp:  JAN 2025  Brock BadHARLES A. Niani Mourer, MD, FACOG Obstetrician & Gynecologist, [facility], [facility], [facility]

## 2017-03-28 LAB — URINE CULTURE

## 2017-03-30 ENCOUNTER — Telehealth: Payer: Self-pay

## 2017-03-30 ENCOUNTER — Encounter: Payer: Self-pay | Admitting: *Deleted

## 2017-03-30 ENCOUNTER — Other Ambulatory Visit: Payer: Self-pay | Admitting: Obstetrics

## 2017-03-30 DIAGNOSIS — B9620 Unspecified Escherichia coli [E. coli] as the cause of diseases classified elsewhere: Secondary | ICD-10-CM

## 2017-03-30 DIAGNOSIS — N390 Urinary tract infection, site not specified: Principal | ICD-10-CM

## 2017-03-30 MED ORDER — CEFUROXIME AXETIL 500 MG PO TABS: 500.0000 mg | ORAL_TABLET | Freq: Two times a day (BID) | ORAL | 0 refills | Status: DC

## 2017-03-30 NOTE — Telephone Encounter (Signed)
-----   Message from Brock Badharles A Harper, MD sent at 03/30/2017  8:29 AM EST ----- Ceftin Rx for UTI

## 2017-03-30 NOTE — Telephone Encounter (Signed)
TC to pt twice on home phone and cell unable to reach pt  Per voice over unable to reach pt with the phone I am using.

## 2017-04-09 ENCOUNTER — Telehealth: Payer: Self-pay

## 2017-04-09 NOTE — Telephone Encounter (Signed)
Attempted to contact, pt left vm saying that she recived letter that we are trying to contact her about lab results. Pt left # [phone], called and phone is not accepting calls.

## 2017-05-07 ENCOUNTER — Ambulatory Visit: Payer: Medicaid Other | Admitting: Obstetrics

## 2017-06-08 ENCOUNTER — Encounter: Payer: Self-pay | Admitting: Obstetrics

## 2017-06-08 ENCOUNTER — Ambulatory Visit (INDEPENDENT_AMBULATORY_CARE_PROVIDER_SITE_OTHER): Payer: Medicaid Other | Admitting: Obstetrics

## 2017-06-08 ENCOUNTER — Encounter: Payer: Self-pay | Admitting: *Deleted

## 2017-06-08 VITALS — BP 108/71 | HR 83 | Temp 98.6°F | Resp 16 | Wt 180.5 lb

## 2017-06-08 DIAGNOSIS — Z30431 Encounter for routine checking of intrauterine contraceptive device: Secondary | ICD-10-CM

## 2017-06-08 DIAGNOSIS — Z87440 Personal history of urinary (tract) infections: Secondary | ICD-10-CM | POA: Diagnosis not present

## 2017-06-08 DIAGNOSIS — R8290 Unspecified abnormal findings in urine: Secondary | ICD-10-CM | POA: Diagnosis not present

## 2017-06-08 DIAGNOSIS — N944 Primary dysmenorrhea: Secondary | ICD-10-CM

## 2017-06-08 LAB — POCT URINALYSIS DIP (MANUAL ENTRY)
Bilirubin, UA: NEGATIVE
GLUCOSE UA: NEGATIVE mg/dL
Ketones, POC UA: NEGATIVE mg/dL
Leukocytes, UA: NEGATIVE
NITRITE UA: NEGATIVE
PH UA: 6 (ref 5.0–8.0)
Protein Ur, POC: NEGATIVE mg/dL
UROBILINOGEN UA: 0.2 U/dL

## 2017-06-08 MED ORDER — NITROFURANTOIN MONOHYD MACRO 100 MG PO CAPS: 100.0000 mg | ORAL_CAPSULE | Freq: Two times a day (BID) | ORAL | 2 refills | Status: AC

## 2017-06-08 MED ORDER — IBUPROFEN 800 MG PO TABS: 800.0000 mg | ORAL_TABLET | Freq: Three times a day (TID) | ORAL | 5 refills | Status: AC | PRN

## 2017-06-08 NOTE — Progress Notes (Signed)
Subjective:    Cheryl Wolfe is a 32 y.o. female who presents for contraception counseling. The patient has no complaints today. The patient is sexually active. Pertinent past medical history: none.  The information documented in the HPI was reviewed and verified.  Menstrual History: OB History    Gravida  4   Para  4   Term  4   Preterm      AB      Living  4     SAB      TAB      Ectopic      Multiple  0   Live Births  4           No LMP recorded.   Patient Active Problem List   Diagnosis Date Noted  . Hirsutism 03/20/2017  . Pregnant and not yet delivered 11/25/2015  . Gestational diabetes 11/15/2015  . Supervision of normal pregnancy, antepartum 10/15/2015   Past Medical History:  Diagnosis Date  . Gestational diabetes     Past Surgical History:  Procedure Laterality Date  . COSMETIC SURGERY     Tummy Tuck      Current Outpatient Medications:  .  ibuprofen (ADVIL,MOTRIN) 800 MG tablet, Take 1 tablet (800 mg total) by mouth every 8 (eight) hours as needed., Disp: 30 tablet, Rfl: 5 .  nitrofurantoin, macrocrystal-monohydrate, (MACROBID) 100 MG capsule, Take 1 capsule (100 mg total) by mouth 2 (two) times daily., Disp: 14 capsule, Rfl: 2 No Known Allergies  Social History   Tobacco Use  . Smoking status: Former Games developermoker  . Smokeless tobacco: Never Used  Substance Use Topics  . Alcohol use: No    History reviewed. No pertinent family history.     Review of Systems Constitutional: negative for weight loss Genitourinary:negative for abnormal menstrual periods and vaginal discharge   Objective:   BP 108/71 (BP Location: Right Arm, Patient Position: Sitting, Cuff Size: Large)   Pulse 83   Temp 98.6 F (37 C) (Oral)   Resp 16   Wt 180 lb 8 oz (81.9 kg)   BMI 34.11 kg/m           General:  Alert and no distress Abdomen:  normal findings: no organomegaly, soft, non-tender and no hernia  Pelvis:  External genitalia: normal general  appearance Urinary system: urethral meatus normal and bladder without fullness, nontender Vaginal: normal without tenderness, induration or masses Cervix: normal appearance Adnexa: normal bimanual exam Uterus: anteverted and non-tender, normal size   Lab Review Urine pregnancy test Labs reviewed yes Radiologic studies reviewed no  50% of 15 min visit spent on counseling and coordination of care.    Assessment:    32 y.o., continuing IUD, no contraindications.   1. IUD check up - DOING WELL  2. Abnormal urine odor Rx: - POCT urinalysis dipstick - Urine Culture - nitrofurantoin, macrocrystal-monohydrate, (MACROBID) 100 MG capsule; Take 1 capsule (100 mg total) by mouth 2 (two) times daily.  Dispense: 14 capsule; Refill: 2  3. History of recurrent UTIs - URINE CULTURE - MACROBID Rx  4. Primary dysmenorrhea Rx: - ibuprofen (ADVIL,MOTRIN) 800 MG tablet; Take 1 tablet (800 mg total) by mouth every 8 (eight) hours as needed.  Dispense: 30 tablet; Refill: 5   Plan:    All questions answered. Discussed healthy lifestyle modifications. Follow up in 10 months. Urine culture and sensitivity.    Meds ordered this encounter  Medications  . nitrofurantoin, macrocrystal-monohydrate, (MACROBID) 100 MG capsule  Sig: Take 1 capsule (100 mg total) by mouth 2 (two) times daily.    Dispense:  14 capsule    Refill:  2  . ibuprofen (ADVIL,MOTRIN) 800 MG tablet    Sig: Take 1 tablet (800 mg total) by mouth every 8 (eight) hours as needed.    Dispense:  30 tablet    Refill:  5   Orders Placed This Encounter  Procedures  . Urine Culture  . POCT urinalysis dipstick    Brock Bad MD 06-08-2017

## 2017-06-10 ENCOUNTER — Other Ambulatory Visit: Payer: Self-pay | Admitting: Obstetrics

## 2017-06-10 LAB — URINE CULTURE

## 2017-06-19 ENCOUNTER — Encounter (HOSPITAL_COMMUNITY): Payer: Self-pay

## 2017-06-19 ENCOUNTER — Emergency Department (HOSPITAL_COMMUNITY)
Admission: EM | Admit: 2017-06-19 | Discharge: 2017-06-19 | Disposition: A | Discharge status: Home / Self Care | Payer: Medicaid Other | Attending: Emergency Medicine | Admitting: Emergency Medicine

## 2017-06-19 ENCOUNTER — Other Ambulatory Visit: Payer: Self-pay

## 2017-06-19 DIAGNOSIS — R0981 Nasal congestion: Secondary | ICD-10-CM | POA: Diagnosis not present

## 2017-06-19 DIAGNOSIS — Z87891 Personal history of nicotine dependence: Secondary | ICD-10-CM | POA: Insufficient documentation

## 2017-06-19 DIAGNOSIS — J020 Streptococcal pharyngitis: Secondary | ICD-10-CM

## 2017-06-19 DIAGNOSIS — R05 Cough: Secondary | ICD-10-CM | POA: Diagnosis not present

## 2017-06-19 DIAGNOSIS — R6883 Chills (without fever): Secondary | ICD-10-CM | POA: Insufficient documentation

## 2017-06-19 DIAGNOSIS — R4702 Dysphasia: Secondary | ICD-10-CM | POA: Diagnosis not present

## 2017-06-19 DIAGNOSIS — R070 Pain in throat: Secondary | ICD-10-CM | POA: Diagnosis present

## 2017-06-19 DIAGNOSIS — J3489 Other specified disorders of nose and nasal sinuses: Secondary | ICD-10-CM | POA: Insufficient documentation

## 2017-06-19 LAB — GROUP A STREP BY PCR: Group A Strep by PCR: DETECTED — AB

## 2017-06-19 MED ORDER — ACETAMINOPHEN 325 MG PO TABS
650.0000 mg | ORAL_TABLET | Freq: Once | ORAL | Status: AC
  Administered 2017-06-19: 650 mg via ORAL
  Filled 2017-06-19: qty 2

## 2017-06-19 MED ORDER — PENICILLIN G BENZATHINE 1200000 UNIT/2ML IM SUSP
1.2000 10*6.[IU] | Freq: Once | INTRAMUSCULAR | Status: AC
  Administered 2017-06-19: 1.2 10*6.[IU] via INTRAMUSCULAR
  Filled 2017-06-19: qty 2

## 2017-06-19 MED ORDER — DEXAMETHASONE 4 MG PO TABS
10.0000 mg | ORAL_TABLET | Freq: Once | ORAL | Status: AC
  Administered 2017-06-19: 10 mg via ORAL
  Filled 2017-06-19: qty 3

## 2017-06-19 MED ORDER — NAPROXEN 500 MG PO TABS: 500.0000 mg | ORAL_TABLET | Freq: Two times a day (BID) | ORAL | 0 refills | Status: AC

## 2017-06-19 MED ORDER — PREDNISONE 20 MG PO TABS: 60.0000 mg | ORAL_TABLET | Freq: Once | ORAL | Status: DC

## 2017-06-19 MED ORDER — LIDOCAINE VISCOUS 2 % MT SOLN: 15.0000 mL | OROMUCOSAL | 0 refills | Status: AC | PRN

## 2017-06-19 NOTE — ED Triage Notes (Signed)
Patient complains of sore throat, cough, chills and congestion x 3 days, NAD

## 2017-06-19 NOTE — ED Provider Notes (Signed)
[facility] EMERGENCY DEPARTMENT Provider Note   CSN: [id number] Arrival date & time: 06/19/17  0736     History   Chief Complaint Chief Complaint  Patient presents with  . sore throat, chills/ cough    HPI Cheryl Wolfe is a 32 y.o. female with no significant past medical history presents emergency department today for sore throat.  Patient states of the last 2-3 days she has been having sore throat with associated dysphasia, nasal congestion, rhinorrhea, sinus pressure, and intermittent dry, nonproductive cough.  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  She reports that she has taken over-the-counter Tylenol, ibuprofen and Robitussin for her symptoms without relief.  She reports that she had a subjective fever yesterday as well as this morning.  Patient denies any inability to control secretions, chest pain, shortness of breath, hemoptysis, nausea/vomiting/diarrhea, abdominal pain, voice change or trauma.  HPI  Past Medical History:  Diagnosis Date  . Gestational diabetes     Patient Active Problem List   Diagnosis Date Noted  . Hirsutism 03/20/2017  . Pregnant and not yet delivered 11/25/2015  . Gestational diabetes 11/15/2015  . Supervision of normal pregnancy, antepartum 10/15/2015    Past Surgical History:  Procedure Laterality Date  . COSMETIC SURGERY     Tummy Tuck      OB History    Gravida  4   Para  4   Term  4   Preterm      AB      Living  4     SAB      TAB      Ectopic      Multiple  0   Live Births  4            Home Medications    Prior to Admission medications   Medication Sig Start Date End Date Taking? Authorizing Provider  ibuprofen (ADVIL,MOTRIN) 800 MG tablet Take 1 tablet (800 mg total) by mouth every 8 (eight) hours as needed. 06/08/17   Brock Bad, MD  nitrofurantoin, macrocrystal-monohydrate, (MACROBID) 100 MG  capsule Take 1 capsule (100 mg total) by mouth 2 (two) times daily. 06/08/17   Brock Bad, MD    Family History No family history on file.  Social History Social History   Tobacco Use  . Smoking status: Former Games developer  . Smokeless tobacco: Never Used  Substance Use Topics  . Alcohol use: No  . Drug use: No     Allergies   Patient has no known allergies.   Review of Systems Review of Systems  All other systems reviewed and are negative.    Physical Exam Updated Vital Signs BP 112/79 (BP Location: Right Arm)   Pulse 91   Temp 99.9 F (37.7 C) (Oral)   Resp 16   SpO2 98%   Physical Exam  Constitutional: She appears well-developed and well-nourished.  HENT:  Head: Normocephalic and atraumatic.  Right Ear: Tympanic membrane and external ear normal.  Left Ear: Tympanic membrane and external ear normal.  Nose: Mucosal edema present. Right sinus exhibits no maxillary sinus tenderness and no frontal sinus tenderness. Left sinus exhibits no maxillary sinus tenderness and no frontal sinus tenderness.  Mouth/Throat: Uvula is midline, oropharynx is clear and moist and mucous membranes are normal. No tonsillar exudate.  The patient has normal phonation and is in control of secretions. No stridor.  Midline  uvula without edema. Soft palate rises symmetrically. 1+ bilateral tonsillar erythema with exudates. No PTA. Tongue protrusion is normal. No trismus. No creptius on neck palpation and patient has good dentition. No gingival erythema or fluctuance noted. Mucus membranes moist.   Eyes: Pupils are equal, round, and reactive to light. Right eye exhibits no discharge. Left eye exhibits no discharge. No scleral icterus.  Neck: Trachea normal. Neck supple. No spinous process tenderness present. No neck rigidity. Normal range of motion present.  Cardiovascular: Normal rate, regular rhythm and intact distal pulses.  No murmur heard. Pulses:      Radial pulses are 2+ on the right  side, and 2+ on the left side.       Dorsalis pedis pulses are 2+ on the right side, and 2+ on the left side.       Posterior tibial pulses are 2+ on the right side, and 2+ on the left side.  No lower extremity swelling or edema. Calves symmetric in size bilaterally.  Pulmonary/Chest: Effort normal and breath sounds normal. She exhibits no tenderness.  Abdominal: Soft. Bowel sounds are normal. There is no tenderness. There is no rebound and no guarding.  Musculoskeletal: She exhibits no edema.  Lymphadenopathy:       Head (right side): No submental, no submandibular and no tonsillar adenopathy present.       Head (left side): No submental, no submandibular and no tonsillar adenopathy present.    She has cervical adenopathy (anterior).  Neurological: She is alert.  Skin: Skin is warm and dry. No rash noted. She is not diaphoretic.  Psychiatric: She has a normal mood and affect.  Nursing note and vitals reviewed.    ED Treatments / Results  Labs (all labs ordered are listed, but only abnormal results are displayed) Labs Reviewed  GROUP A STREP BY PCR - Abnormal; Notable for the following components:      Result Value   Group A Strep by PCR DETECTED (*)    All other components within normal limits    EKG None  Radiology No results found.  Procedures Procedures (including critical care time)  Medications Ordered in ED Medications  penicillin g benzathine (BICILLIN LA) 1200000 UNIT/2ML injection 1.2 Million Units (has no administration in time range)  dexamethasone (DECADRON) tablet 10 mg (has no administration in time range)  acetaminophen (TYLENOL) tablet 650 mg (650 mg Oral Given 06/19/17 0942)     Initial Impression / Assessment and Plan / ED Course  I have reviewed the triage vital signs and the nursing notes.  Pertinent labs & imaging results that were available during my care of the patient were reviewed by me and considered in my medical decision making (see chart  for details).     Pt febrile at home (99.9 here but antipyretics PTA) with tonsillar exudate, cervical lymphadenopathy, & dysphagia; diagnosis of bacterial pharyngitis. Strep test positive. Treated in the ED with decadron, Tylenol (ibuprofen PTA), and PCN IM.  Pt appears mildly dehydrated, discussed importance of water rehydration. Presentation non concerning for peritonsillar abscess or RPA. No trismus or uvula deviation. Specific return precautions discussed. Pt able to drink water in ED without difficulty with intact air way. Recommended PCP follow up.    Final Clinical Impressions(s) / ED Diagnoses   Final diagnoses:  Strep pharyngitis    ED Discharge Orders        Ordered    naproxen (NAPROSYN) 500 MG tablet  2 times daily     06/19/17 0955  lidocaine (XYLOCAINE) 2 % solution  As needed     06/19/17 0955       Jacinto HalimMaczis, Seren Chaloux M, PA-C 06/19/17 [id number]    Vanetta MuldersZackowski, Scott, MD 06/21/17 [phone]

## 2017-06-19 NOTE — Discharge Instructions (Addendum)
Please read and follow all provided instructions.  Your diagnoses today include:  Strep Throat  Tests performed today include: Vital signs. See below for your results today.  Strep Test: This was positive  Medications prescribed:  You received antibiotics in the department You received a long acting steroid in the department.  Please take naproxen as prescribed Please use viscous lidocaine as directed. Swish, gargle and spit. Do not swallow.  Do not take your medicine if develop an itchy rash, swelling in your mouth or lips, or difficulty breathing.   Home care instructions:  This is a bacterial infection. Continue to stay well-hydrated. Gargle warm salt water and spit it out. Continued to alternate between Tylenol and ibuprofen for pain. May consider over-the-counter Benadryl for additional relief (decrease secretions). Also discard your toothbrush and begin using a new one in 3 days. Follow-up with your primary care doctor in this week for recheck of ongoing symptoms.   Follow-up instructions: Please follow-up with your primary care provider in 2-3 days for follow up.    Return instructions:  Return to the ED sooner for worsening condition, inability to swallow, breathing difficulty, new concerns.  Additional Information:  Your vital signs today were: BP 112/79 (BP Location: Right Arm)    Pulse 91    Temp 99.9 F (37.7 C) (Oral)    Resp 16    SpO2 98%  If your blood pressure (BP) was elevated above 135/85 this visit, please have this repeated by your doctor within one month. ---------------

## 2017-06-20 ENCOUNTER — Telehealth: Payer: Self-pay

## 2017-06-20 NOTE — Telephone Encounter (Signed)
Post ED Visit - Positive Culture Follow-up  Culture report reviewed by antimicrobial stewardship pharmacist:   Enzo Bi, Pharm.D.  Celedonio Miyamoto, Pharm.D., BCPS AQ-ID  Garvin Fila, Pharm.D., BCPS  Georgina Pillion, Pharm.D., BCPS  [place], [address].D., BCPS, AAHIVP  Estella Husk, Pharm.D., BCPS, AAHIVP  Lysle Pearl, PharmD, BCPS  Blake Divine, PharmD  Pollyann Samples, PharmD, BCPS [facility] Pharm D Positive strep culture Treated with Bicillin LA, organism sensitive to the same and no further patient follow-up is required at this time.  Jerry Caras 06/20/2017, 9:05 AM

## 2017-08-29 ENCOUNTER — Encounter (HOSPITAL_COMMUNITY): Payer: Self-pay | Admitting: *Deleted

## 2017-08-29 ENCOUNTER — Emergency Department (HOSPITAL_COMMUNITY)
Admission: EM | Admit: 2017-08-29 | Discharge: 2017-08-30 | Disposition: A | Discharge status: Home / Self Care | Payer: Medicaid Other | Attending: Emergency Medicine | Admitting: Emergency Medicine

## 2017-08-29 ENCOUNTER — Other Ambulatory Visit: Payer: Self-pay

## 2017-08-29 DIAGNOSIS — J020 Streptococcal pharyngitis: Secondary | ICD-10-CM | POA: Diagnosis not present

## 2017-08-29 DIAGNOSIS — Z87891 Personal history of nicotine dependence: Secondary | ICD-10-CM | POA: Diagnosis not present

## 2017-08-29 DIAGNOSIS — Z79899 Other long term (current) drug therapy: Secondary | ICD-10-CM | POA: Insufficient documentation

## 2017-08-29 DIAGNOSIS — H00011 Hordeolum externum right upper eyelid: Secondary | ICD-10-CM

## 2017-08-29 DIAGNOSIS — H5711 Ocular pain, right eye: Secondary | ICD-10-CM | POA: Diagnosis present

## 2017-08-29 LAB — GROUP A STREP BY PCR: GROUP A STREP BY PCR: DETECTED — AB

## 2017-08-29 MED ORDER — DEXAMETHASONE SODIUM PHOSPHATE 10 MG/ML IJ SOLN
10.0000 mg | Freq: Once | INTRAMUSCULAR | Status: AC
  Administered 2017-08-29: 10 mg via INTRAMUSCULAR
  Filled 2017-08-29: qty 1

## 2017-08-29 MED ORDER — FLUORESCEIN SODIUM 1 MG OP STRP
1.0000 | ORAL_STRIP | Freq: Once | OPHTHALMIC | Status: DC
  Filled 2017-08-29: qty 1

## 2017-08-29 MED ORDER — TETRACAINE HCL 0.5 % OP SOLN
2.0000 [drp] | Freq: Once | OPHTHALMIC | Status: DC
  Filled 2017-08-29: qty 4

## 2017-08-29 MED ORDER — PENICILLIN G BENZATHINE 1200000 UNIT/2ML IM SUSP
1.2000 10*6.[IU] | Freq: Once | INTRAMUSCULAR | Status: AC
  Administered 2017-08-29: 1.2 10*6.[IU] via INTRAMUSCULAR
  Filled 2017-08-29: qty 2

## 2017-08-29 NOTE — ED Triage Notes (Signed)
Pt stated "I've had this thing on my eye (right) x 3 weeks and my throat started hurting x 1.

## 2017-08-29 NOTE — ED Provider Notes (Signed)
[facility]-EMERGENCY DEPT Provider Note   CSN: [id number] Arrival date & time: 08/29/17  2101     History   Chief Complaint Chief Complaint  Patient presents with  . Eye Pain    right   . Sore Throat    HPI Cheryl Wolfe is a 32 y.o. female with no major medical problems presents to the Emergency Department complaining of gradual, persistent, progressively worsening external hordeolum in the right upper eyelid onset approximately 3 weeks ago.  Patient reports she has used warm compresses and teabags without relief.  No additional treatments attempted.  Patient reports no pain with EOMs, discharge from the orbit or global swelling of the lid.  She denies vision changes, headache, neck pain or neck stiffness.   Additionally, patient complains of sore throat onset approximately 1 week ago.  She reports several months ago she had strep pharyngitis and was given a medication.  She reports taking this over the last week with some relief.  She states this is not an antibiotic but does not remember what it is.  She has associated rhinorrhea and dry cough.  She denies fevers or chills, neck pain or stiffness, chest pain, shortness of breath, abdominal pain, nausea or vomiting, diarrhea, weakness, dizziness.  Patient denies known sick contacts.  The history is provided by the patient and medical records. No language interpreter was used.    Past Medical History:  Diagnosis Date  . Gestational diabetes     Patient Active Problem List   Diagnosis Date Noted  . Hirsutism 03/20/2017  . Pregnant and not yet delivered 11/25/2015  . Gestational diabetes 11/15/2015  . Supervision of normal pregnancy, antepartum 10/15/2015    Past Surgical History:  Procedure Laterality Date  . COSMETIC SURGERY     Tummy Tuck      OB History    Gravida  4   Para  4   Term  4   Preterm      AB      Living  4     SAB      TAB      Ectopic      Multiple  0   Live Births  4            Home Medications    Prior to Admission medications   Medication Sig Start Date End Date Taking? Authorizing Provider  erythromycin ophthalmic ointment Place a 1/2 inch ribbon of ointment into the lower eyelid every 4 hours for 5 days 08/30/17   Jenniferann Stuckert, Dahlia Client, PA-C  ibuprofen (ADVIL,MOTRIN) 800 MG tablet Take 1 tablet (800 mg total) by mouth every 8 (eight) hours as needed. 06/08/17   Brock Bad, MD  lidocaine (XYLOCAINE) 2 % solution Use as directed 15 mLs in the mouth or throat as needed for mouth pain. 06/19/17   Maczis, Elmer Sow, PA-C  naproxen (NAPROSYN) 500 MG tablet Take 1 tablet (500 mg total) by mouth 2 (two) times daily. 06/19/17   Maczis, Elmer Sow, PA-C  nitrofurantoin, macrocrystal-monohydrate, (MACROBID) 100 MG capsule Take 1 capsule (100 mg total) by mouth 2 (two) times daily. 06/08/17   Brock Bad, MD    Family History No family history on file.  Social History Social History   Tobacco Use  . Smoking status: Former Games developer  . Smokeless tobacco: Never Used  Substance Use Topics  . Alcohol use: No  . Drug use: No     Allergies   Patient has no  known allergies.   Review of Systems Review of Systems  Constitutional: Negative for appetite change, diaphoresis, fatigue, fever and unexpected weight change.  HENT: Positive for congestion and sore throat. Negative for mouth sores.   Eyes: Positive for pain. Negative for visual disturbance.  Respiratory: Positive for cough. Negative for chest tightness, shortness of breath and wheezing.   Cardiovascular: Negative for chest pain.  Gastrointestinal: Negative for abdominal pain, constipation, diarrhea, nausea and vomiting.  Endocrine: Negative for polydipsia, polyphagia and polyuria.  Genitourinary: Negative for dysuria, frequency, hematuria and urgency.  Musculoskeletal: Negative for back pain and neck stiffness.  Skin: Negative for rash.  Allergic/Immunologic: Negative for  immunocompromised state.  Neurological: Negative for syncope, light-headedness and headaches.  Hematological: Does not bruise/bleed easily.  Psychiatric/Behavioral: Negative for sleep disturbance. The patient is not nervous/anxious.      Physical Exam Updated Vital Signs BP 123/90 (BP Location: Left Arm)   Pulse 84   Temp 98.3 F (36.8 C) (Oral)   Resp 16   Ht 5\' 2"  (1.575 m)   Wt 83.2 kg (183 lb 6.4 oz)   LMP 08/24/2017 (Exact Date)   SpO2 100%   BMI 33.54 kg/m   Physical Exam  Constitutional: She appears well-developed and well-nourished. No distress.  HENT:  Head: Normocephalic and atraumatic.  Right Ear: Tympanic membrane, external ear and ear canal normal.  Left Ear: Tympanic membrane, external ear and ear canal normal.  Nose: Nose normal. No mucosal edema or rhinorrhea.  Mouth/Throat: Uvula is midline and mucous membranes are normal. Mucous membranes are not dry. No trismus in the jaw. No uvula swelling. Posterior oropharyngeal edema and posterior oropharyngeal erythema present. No oropharyngeal exudate or tonsillar abscesses.  Mild erythema and edema of the tonsils.  No exudate.  Eyes: Conjunctivae are normal. Right eye exhibits hordeolum. Left eye exhibits no hordeolum.    Neck: Normal range of motion, full passive range of motion without pain and phonation normal. No tracheal tenderness, no spinous process tenderness and no muscular tenderness present. No neck rigidity. No erythema and normal range of motion present. No Brudzinski's sign and no Kernig's sign noted.  Range of motion without pain  No midline or paraspinal tenderness Normal phonation No stridor Handling secretions without difficulty No nuchal rigidity or meningeal signs  Cardiovascular: Normal rate, regular rhythm and normal heart sounds.  Pulses:      Radial pulses are 2+ on the right side, and 2+ on the left side.  Pulmonary/Chest: Effort normal and breath sounds normal. No stridor. No respiratory  distress. She has no decreased breath sounds. She has no wheezes.  Equal chest expansion, clear and equal breath sounds without focal wheezes, rhonchi or rales  Musculoskeletal: Normal range of motion.  Lymphadenopathy:       Head (right side): Submandibular and tonsillar adenopathy present. No submental, no preauricular, no posterior auricular and no occipital adenopathy present.       Head (left side): Submandibular and tonsillar adenopathy present. No submental, no preauricular, no posterior auricular and no occipital adenopathy present.    She has no cervical adenopathy (No cervical lymphadenopathy).       Right cervical: No superficial cervical, no deep cervical and no posterior cervical adenopathy present.      Left cervical: No superficial cervical, no deep cervical and no posterior cervical adenopathy present.  Neurological: She is alert.  Alert and oriented Moves all extremities without ataxia  Skin: Skin is warm and dry. She is not diaphoretic.  Psychiatric: She has  a normal mood and affect.  Nursing note and vitals reviewed.    ED Treatments / Results  Labs (all labs ordered are listed, but only abnormal results are displayed) Labs Reviewed  GROUP A STREP BY PCR - Abnormal; Notable for the following components:      Result Value   Group A Strep by PCR DETECTED (*)    All other components within normal limits     Procedures .Marland Kitchen.Incision and Drainage Date/Time: 08/29/2017 10:55 PM Performed by: Dierdre ForthMuthersbaugh, Gable Odonohue, PA-C Authorized by: Dierdre ForthMuthersbaugh, Quentez Lober, PA-C   Consent:    Consent obtained:  Verbal   Consent given by:  Patient   Risks discussed:  Bleeding, incomplete drainage, infection, pain and damage to other organs   Alternatives discussed:  No treatment Location:    Indications for incision and drainage: external hordeolum.   Location:  Head   Head location:  R eyelid Pre-procedure details:    Skin preparation:  Antiseptic wash Anesthesia (see MAR for exact  dosages):    Anesthesia method:  None Procedure type:    Complexity:  Simple Procedure details:    Needle aspiration: yes     Needle size:  20 G   Drainage:  Purulent and bloody   Drainage amount:  Moderate   Wound treatment:  Wound left open   Packing materials:  None Post-procedure details:    Patient tolerance of procedure:  Tolerated well, no immediate complications   (including critical care time)  Medications Ordered in ED Medications  fluorescein ophthalmic strip 1 strip (1 strip Right Eye Given 08/29/17 2228)  tetracaine (PONTOCAINE) 0.5 % ophthalmic solution 2 drop (2 drops Right Eye Given 08/29/17 2228)  penicillin g benzathine (BICILLIN LA) 1200000 UNIT/2ML injection 1.2 Million Units (1.2 Million Units Intramuscular Given 08/29/17 2352)  dexamethasone (DECADRON) injection 10 mg (10 mg Intramuscular Given 08/29/17 2353)     Initial Impression / Assessment and Plan / ED Course  I have reviewed the triage vital signs and the nursing notes.  Pertinent labs & imaging results that were available during my care of the patient were reviewed by me and considered in my medical decision making (see chart for details).     Patient with external hordeolum and strep pharyngitis.  External hordeolum drained here in the emergency department.  No spreading infection.  Will give erythromycin ointment.  Pt febrile with dysphagia and positive rapid strep.  Treated in the ED with steroids, and PCN IM.  Pt does not appear dehydrated, discussed importance of water rehydration. Presentation non concerning for PTA or RPA. No trismus or uvula deviation. Specific return precautions discussed. Pt able to drink water in ED without difficulty with intact air way. Recommended PCP follow up.    Final Clinical Impressions(s) / ED Diagnoses   Final diagnoses:  Strep pharyngitis  Hordeolum externum of right upper eyelid    ED Discharge Orders        Ordered    erythromycin ophthalmic ointment      08/30/17 0011       Mykelti Goldenstein, Boyd KerbsHannah, PA-C 08/30/17 0012    Lorre NickAllen, Anthony, MD 08/30/17 1537

## 2017-08-29 NOTE — ED Notes (Signed)
Bed: WA01 Expected date:  Expected time:  Means of arrival:  Comments: Triage 5  

## 2017-08-29 NOTE — ED Notes (Signed)
Pt states she wears Rx collective glasses that she did not bring with her;Visaul acuity without is 20/100 bilateral far.

## 2017-08-30 MED ORDER — ERYTHROMYCIN 5 MG/GM OP OINT: TOPICAL_OINTMENT | OPHTHALMIC | 0 refills | Status: AC

## 2017-08-30 MED ORDER — MAGIC MOUTHWASH W/LIDOCAINE: 5.0000 mL | Freq: Three times a day (TID) | ORAL | 0 refills | Status: AC | PRN

## 2017-08-30 NOTE — Discharge Instructions (Addendum)
1. Medications: Erythromycin for stye, usual home medications 2. Treatment: rest, drink plenty of fluids, continue warm compresses 3. Follow Up: Please followup with your primary doctor in 2-3 days for discussion of your diagnoses and further evaluation after today's visit; if you do not have a primary care doctor use the resource guide provided to find one; Please return to the ER for debility breathing, difficulty swallowing, high fevers, changes in vision or other concerns.

## 2017-10-24 ENCOUNTER — Emergency Department (HOSPITAL_COMMUNITY)
Admission: EM | Admit: 2017-10-24 | Discharge: 2017-10-24 | Disposition: A | Discharge status: Home / Self Care | Payer: Medicaid Other | Attending: Emergency Medicine | Admitting: Emergency Medicine

## 2017-10-24 ENCOUNTER — Encounter (HOSPITAL_COMMUNITY): Payer: Self-pay

## 2017-10-24 ENCOUNTER — Other Ambulatory Visit: Payer: Self-pay

## 2017-10-24 DIAGNOSIS — J020 Streptococcal pharyngitis: Secondary | ICD-10-CM

## 2017-10-24 DIAGNOSIS — Z79899 Other long term (current) drug therapy: Secondary | ICD-10-CM | POA: Diagnosis not present

## 2017-10-24 DIAGNOSIS — R030 Elevated blood-pressure reading, without diagnosis of hypertension: Secondary | ICD-10-CM | POA: Diagnosis not present

## 2017-10-24 DIAGNOSIS — J029 Acute pharyngitis, unspecified: Secondary | ICD-10-CM | POA: Diagnosis present

## 2017-10-24 DIAGNOSIS — K0889 Other specified disorders of teeth and supporting structures: Secondary | ICD-10-CM | POA: Diagnosis not present

## 2017-10-24 LAB — GROUP A STREP BY PCR: Group A Strep by PCR: DETECTED — AB

## 2017-10-24 LAB — POC URINE PREG, ED: Preg Test, Ur: NEGATIVE

## 2017-10-24 MED ORDER — AMOXICILLIN-POT CLAVULANATE 875-125 MG PO TABS: 1.0000 | ORAL_TABLET | Freq: Two times a day (BID) | ORAL | 0 refills | Status: AC

## 2017-10-24 MED ORDER — DEXAMETHASONE SODIUM PHOSPHATE 10 MG/ML IJ SOLN
10.0000 mg | Freq: Once | INTRAMUSCULAR | Status: AC
  Administered 2017-10-24: 10 mg via INTRAMUSCULAR
  Filled 2017-10-24: qty 1

## 2017-10-24 MED ORDER — ACETAMINOPHEN 500 MG PO TABS
1000.0000 mg | ORAL_TABLET | Freq: Once | ORAL | Status: AC
  Administered 2017-10-24: 1000 mg via ORAL
  Filled 2017-10-24: qty 2

## 2017-10-24 MED ORDER — KETOROLAC TROMETHAMINE 30 MG/ML IJ SOLN
30.0000 mg | Freq: Once | INTRAMUSCULAR | Status: AC
  Administered 2017-10-24: 30 mg via INTRAMUSCULAR
  Filled 2017-10-24: qty 1

## 2017-10-24 MED ORDER — AMOXICILLIN-POT CLAVULANATE 875-125 MG PO TABS
1.0000 | ORAL_TABLET | Freq: Once | ORAL | Status: AC
  Administered 2017-10-24: 1 via ORAL
  Filled 2017-10-24: qty 1

## 2017-10-24 NOTE — ED Triage Notes (Signed)
C/O sore throat x 3 days.  States she went to [facility]D on 1st day and given PCN for strep but isn't getting any better.  Also c/o sore tooth to upper LT and lips feel swollen.  No distress noted, speaking in full sentences, skin warm and dry.

## 2017-10-24 NOTE — Discharge Instructions (Signed)
Please read and follow all provided instructions.  Your diagnoses today include:  1. Strep pharyngitis   2. Pain, dental   3. Elevated blood pressure reading without diagnosis of hypertension     Tests performed today include: Strep test: was POSITIVE for strep throat Vital signs. See below for your results today.   Medications prescribed:  Please take all of your antibiotics until finished.   You may develop abdominal discomfort or nausea from the antibiotic. If this occurs, you may take it with food. Some patients also get diarrhea with antibiotics. You may help offset this with probiotics which you can buy or get in yogurt. Do not eat or take the probiotics until 2 hours after your antibiotic. Some women develop vaginal yeast infections after antibiotics. If you develop unusual vaginal discharge after being on this medication, please see your primary care provider.   Some people develop allergies to antibiotics. Symptoms of antibiotic allergy can be mild and include a flat rash and itching. They can also be more serious and include:  ?Hives - Hives are raised, red patches of skin that are usually very itchy.  ?Lip or tongue swelling  ?Trouble swallowing or breathing  ?Blistering of the skin or mouth.  If you have any of these serious symptoms, please seek emergency medical care immediately.   Take any medications prescribed only as directed.   Home care instructions:  Please read the educational materials provided and follow any instructions contained in this packet.  Follow-up instructions: Please follow-up with your primary care provider as needed for further evaluation of your symptoms.  Return instructions:  Please return to the Emergency Department if you experience worsening symptoms.  Return if you have worsening problems swallowing, your neck becomes swollen, you cannot swallow your saliva or your voice becomes muffled.  Return with high persistent fever, persistent  vomiting, or if you have trouble breathing.  Please return if you have any other emergent concerns.  Additional Information:  Your vital signs today were: BP (!) 132/97 (BP Location: Left Arm)    Pulse 76    Temp 98.2 F (36.8 C) (Oral)    Resp 18    Ht 5\' 2"  (1.575 m)    Wt 82.6 kg    LMP 09/18/2017    SpO2 100%    BMI 33.29 kg/m  If your blood pressure (BP) was elevated above 135/85 this visit, please have this repeated by your doctor within one month.

## 2017-10-25 NOTE — ED Provider Notes (Signed)
[facility]-EMERGENCY DEPT Provider Note   CSN: [id number] Arrival date & time: 10/24/17  1629     History   Chief Complaint Chief Complaint  Patient presents with  . Sore Throat    HPI Cheryl Wolfe is a 32 y.o. female.  HPI   Patient is a 32 year old female with a history of hirsutism presenting for sore throat and recent streptococcal pharyngitis diagnosis.  Patient reports that she presented to her primary care provider 2 days ago and received a diagnosis of Strep throat and was prescribed penicillin pills.  Patient reports that she has taken 5 doses the penicillin, feels no difference in her pain, and feels that she has had increasing pain in the back of her throat.  Patient denies any obstructive swallowing, neck induration, or submandibular tenderness.  Patient does note that she developed some dental pain in her left upper molar where she has a broken tooth.  Patient denies any fever or chills over this interval.  Patient last took antipyretics at approximately 11 AM today.  Past Medical History:  Diagnosis Date  . Gestational diabetes     Patient Active Problem List   Diagnosis Date Noted  . Hirsutism 03/20/2017  . Pregnant and not yet delivered 11/25/2015  . Gestational diabetes 11/15/2015  . Supervision of normal pregnancy, antepartum 10/15/2015    Past Surgical History:  Procedure Laterality Date  . COSMETIC SURGERY     Tummy Tuck      OB History    Gravida  4   Para  4   Term  4   Preterm      AB      Living  4     SAB      TAB      Ectopic      Multiple  0   Live Births  4            Home Medications    Prior to Admission medications   Medication Sig Start Date End Date Taking? Authorizing Provider  amoxicillin-clavulanate (AUGMENTIN) 875-125 MG tablet Take 1 tablet by mouth every 12 (twelve) hours for 10 days. 10/24/17 11/03/17  Aviva Kluver B, PA-C  erythromycin ophthalmic ointment Place a 1/2  inch ribbon of ointment into the lower eyelid every 4 hours for 5 days 08/30/17   Muthersbaugh, Dahlia Client, PA-C  ibuprofen (ADVIL,MOTRIN) 800 MG tablet Take 1 tablet (800 mg total) by mouth every 8 (eight) hours as needed. 06/08/17   Brock Bad, MD  lidocaine (XYLOCAINE) 2 % solution Use as directed 15 mLs in the mouth or throat as needed for mouth pain. 06/19/17   Maczis, Elmer Sow, PA-C  magic mouthwash w/lidocaine SOLN Take 5 mLs by mouth 3 (three) times daily as needed for mouth pain. 08/30/17   Muthersbaugh, Dahlia Client, PA-C  naproxen (NAPROSYN) 500 MG tablet Take 1 tablet (500 mg total) by mouth 2 (two) times daily. 06/19/17   Maczis, Elmer Sow, PA-C  nitrofurantoin, macrocrystal-monohydrate, (MACROBID) 100 MG capsule Take 1 capsule (100 mg total) by mouth 2 (two) times daily. 06/08/17   Brock Bad, MD    Family History No family history on file.  Social History Social History   Tobacco Use  . Smoking status: Former Games developer  . Smokeless tobacco: Never Used  Substance Use Topics  . Alcohol use: No  . Drug use: No     Allergies   Patient has no known allergies.   Review of Systems Review of  Systems  Constitutional: Negative for chills and fever.  HENT: Positive for sore throat. Negative for trouble swallowing and voice change.   Respiratory: Negative for stridor.   Gastrointestinal: Negative for nausea and vomiting.     Physical Exam Updated Vital Signs BP (!) 132/97 (BP Location: Left Arm)   Pulse 88   Temp 98.2 F (36.8 C) (Oral)   Resp 18   Ht 5\' 2"  (1.575 m)   Wt 82.6 kg   LMP 09/18/2017   SpO2 100%   BMI 33.29 kg/m   Physical Exam  Constitutional: She appears well-developed and well-nourished. No distress.  Sitting comfortably in bed.  HENT:  Head: Normocephalic and atraumatic.  Mouth/Throat: Uvula is midline and oropharynx is clear and moist. Tonsils are 2+ on the right. Tonsils are 2+ on the left.  Normal phonation. No muffled voice sounds. Patient  swallows secretions without difficulty. Dentition with broken and eroded tooth of left upper molar. No lesions of tongue or buccal mucosa. Uvula midline. No asymmetric swelling of the posterior pharynx. Erythema of posterior pharynx. No tonsillar exuduate. No lingual swelling. No induration inferior to tongue. No submandibular tenderness, swelling, or induration.  Tissues of the neck supple. No cervical lymphadenopathy.  Eyes: Conjunctivae are normal. Right eye exhibits no discharge. Left eye exhibits no discharge.  EOMs normal to gross examination.  Neck: Normal range of motion.  Cardiovascular: Normal rate and regular rhythm.  Intact, 2+ radial pulse.  Pulmonary/Chest:  Normal respiratory effort. Patient converses comfortably. No audible wheeze or stridor.  Abdominal: She exhibits no distension.  Musculoskeletal: Normal range of motion.  Neurological: She is alert.  Cranial nerves intact to gross observation. Patient moves extremities without difficulty.  Skin: Skin is warm and dry. She is not diaphoretic.  Psychiatric: She has a normal mood and affect. Her behavior is normal. Judgment and thought content normal.  Nursing note and vitals reviewed.    ED Treatments / Results  Labs (all labs ordered are listed, but only abnormal results are displayed) Labs Reviewed  GROUP A STREP BY PCR - Abnormal; Notable for the following components:      Result Value   Group A Strep by PCR DETECTED (*)    All other components within normal limits  POC URINE PREG, ED    EKG None  Radiology No results found.  Procedures Procedures (including critical care time)  Medications Ordered in ED Medications  acetaminophen (TYLENOL) tablet 1,000 mg (1,000 mg Oral Given 10/24/17 2004)  ketorolac (TORADOL) 30 MG/ML injection 30 mg (30 mg Intramuscular Given 10/24/17 2003)  dexamethasone (DECADRON) injection 10 mg (10 mg Intramuscular Given 10/24/17 2004)  amoxicillin-clavulanate (AUGMENTIN) 875-125 MG  per tablet 1 tablet (1 tablet Oral Given 10/24/17 2012)     Initial Impression / Assessment and Plan / ED Course  I have reviewed the triage vital signs and the nursing notes.  Pertinent labs & imaging results that were available during my care of the patient were reviewed by me and considered in my medical decision making (see chart for details).  Clinical Course as of Oct 25 29  Sat Oct 24, 2017  2005 Spoke with ED pharmacist Elijah Birk who recommended initiating 10 day course of augmentin instead of PCN. Appreciate his involvement in the care of this patient.    [AM]    Clinical Course User Index [AM] Elisha Ponder, PA-C    Mineola Duan is a 32 y.o. female who presents to ED for  sore throat. On exam,  patient is afebrile with no tonsillar exudates but recent Strep pharyngitis diagnosis. Rapid strep positive. Presentation not concerning for PTA or infection spread to soft tissue. No trismus or uvula deviation. Patient does have broken and eroded tooth that appears uncomplicated. Treated in the ED with steroids, NSAIDs, pain medication. Rx for Augmentin instead of PCN per pharmacy's recommendation, which will also cover for dental infection. Specific return precautions discussed. Patient able to drink water in ED without difficulty with intact air way. Discussed importance of hydration. Recommended PCP follow up. All questions answered.  Final Clinical Impressions(s) / ED Diagnoses   Final diagnoses:  Strep pharyngitis  Pain, dental  Elevated blood pressure reading without diagnosis of hypertension    ED Discharge Orders         Ordered    amoxicillin-clavulanate (AUGMENTIN) 875-125 MG tablet  Every 12 hours     10/24/17 2049           Elisha Ponder, PA-C 10/25/17 0033    Little, Ambrose Finland, MD 10/26/17 2004

## 2018-02-25 IMAGING — US US PELVIS COMPLETE TRANSABD/TRANSVAG
1 series · 14 of 25 positions shown · non-contrast
Comparison: No prior non obstetric pelvic sonogram.

CLINICAL DATA: 31-year-old female with pelvic pain, dysmenorrhea
for 1.5 months and hirsutism. History of Mirena IUD. LMP 01/21/2017.

EXAM:
TRANSABDOMINAL AND TRANSVAGINAL ULTRASOUND OF PELVIS
TECHNIQUE: Both transabdominal and transvaginal ultrasound examinations of the
pelvis were performed. Transabdominal technique was performed for
global imaging of the pelvis including uterus, ovaries, adnexal
regions, and pelvic cul-de-sac. It was necessary to proceed with
endovaginal exam following the transabdominal exam to visualize the
endometrium and adnexa.

[Series 1: us pelvis complete transabd/transvag · 14 of 87 slices shown]
[im 1/87]
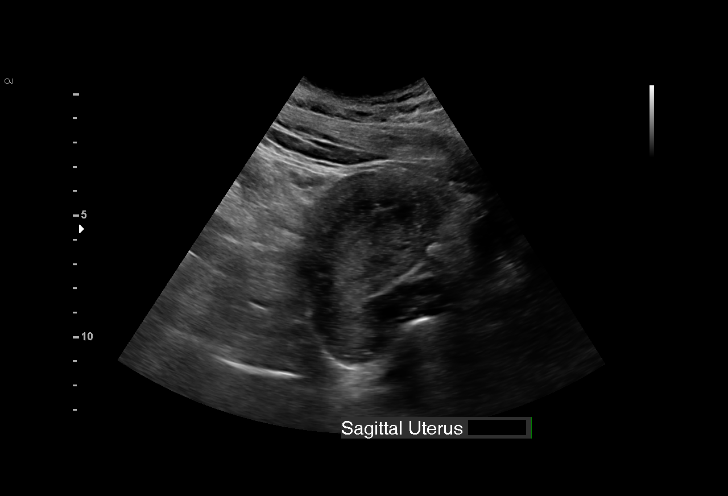
[im 8/87]
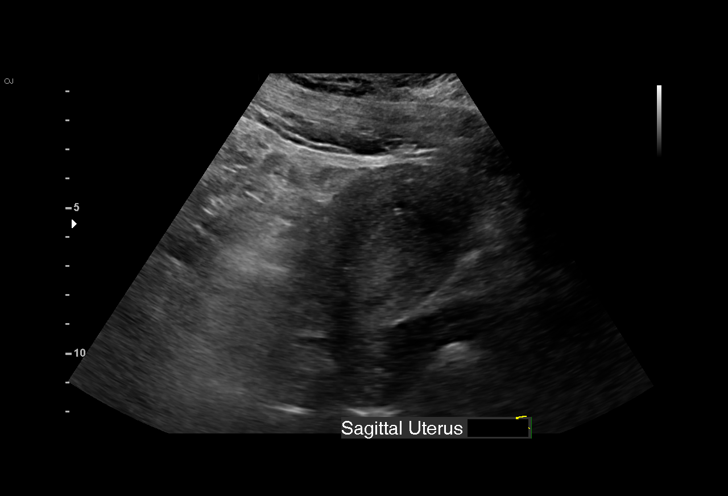
[im 15/87]
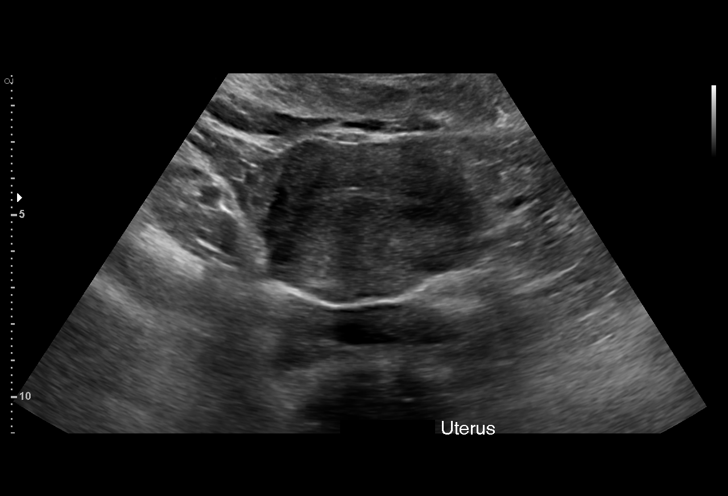
[im 22/87]
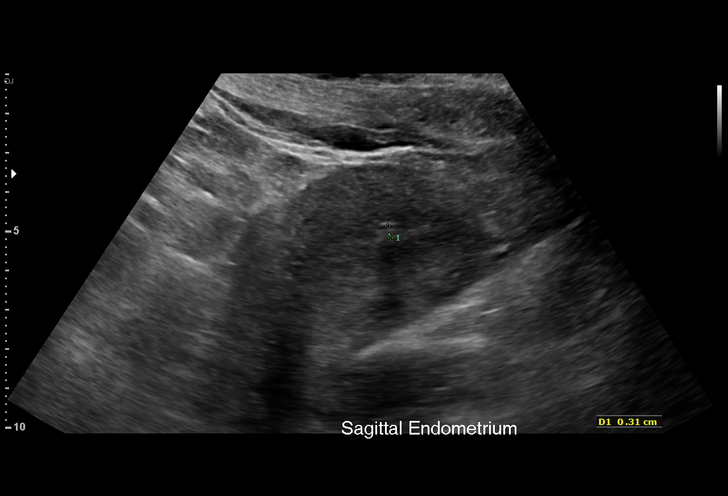
[im 29/87]
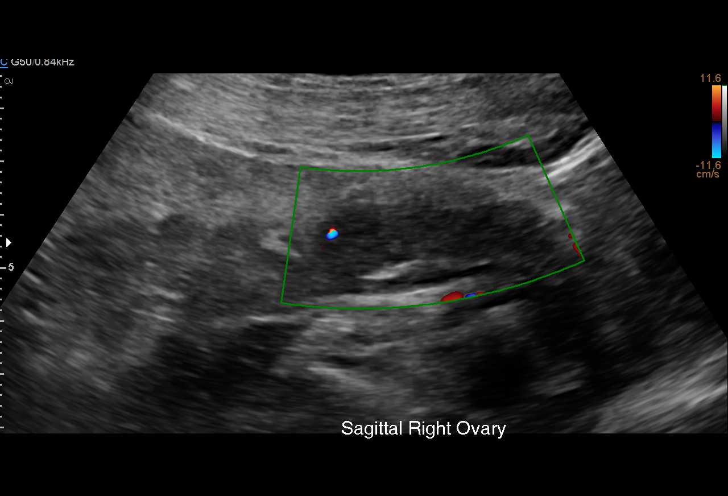
[im 33/87]
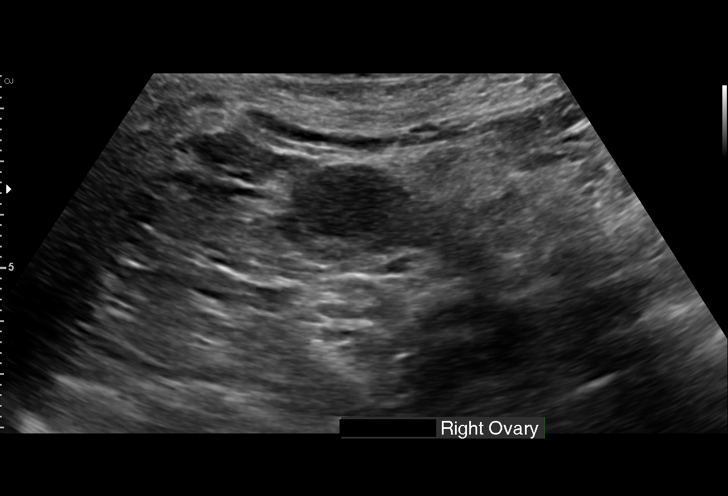
[im 40/87]
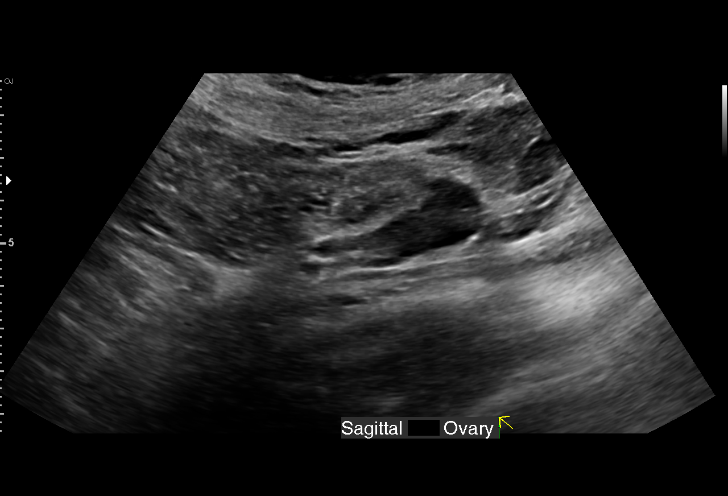
[im 47/87]
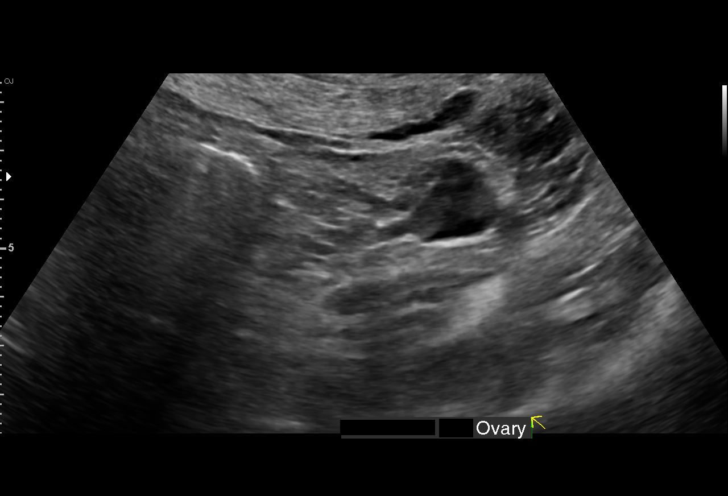
[im 54/87]
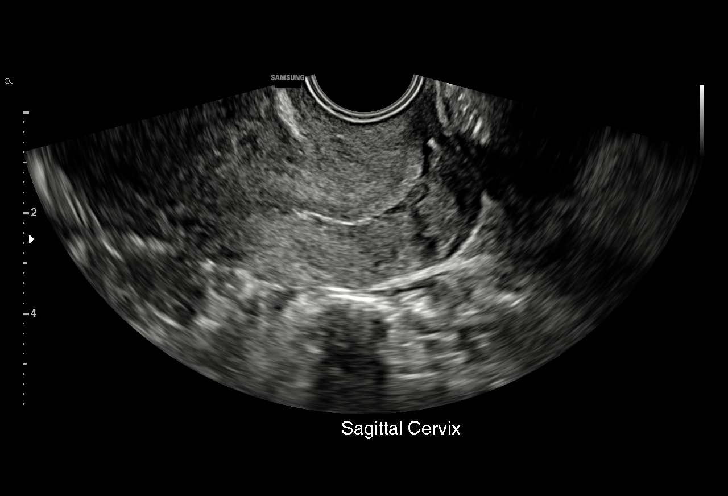
[im 58/87]
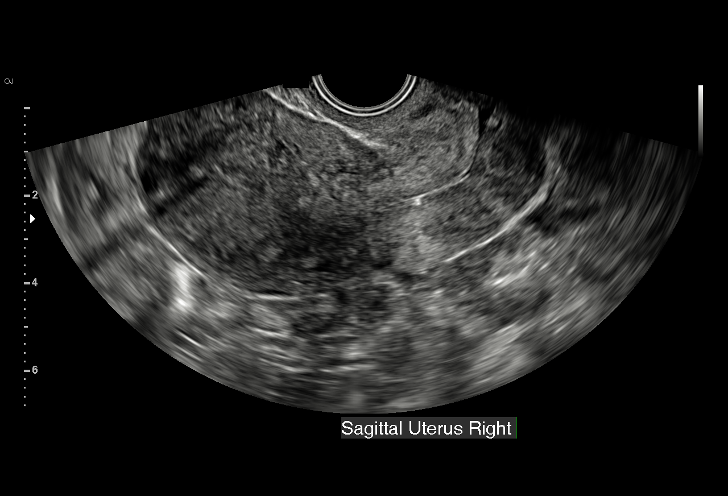
[im 65/87]
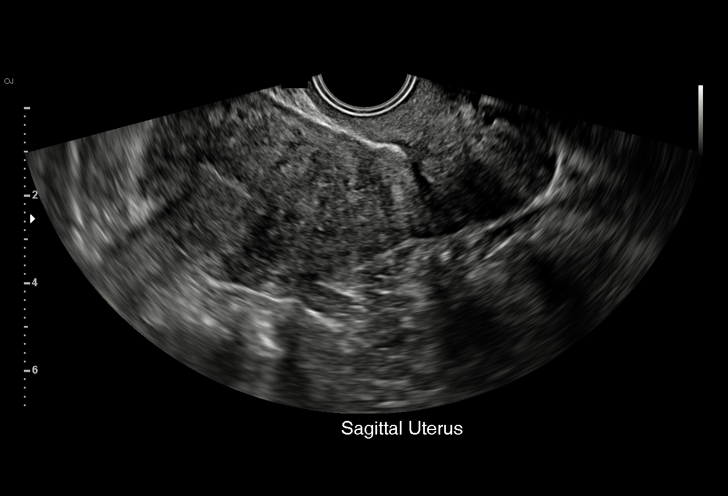
[im 72/87]
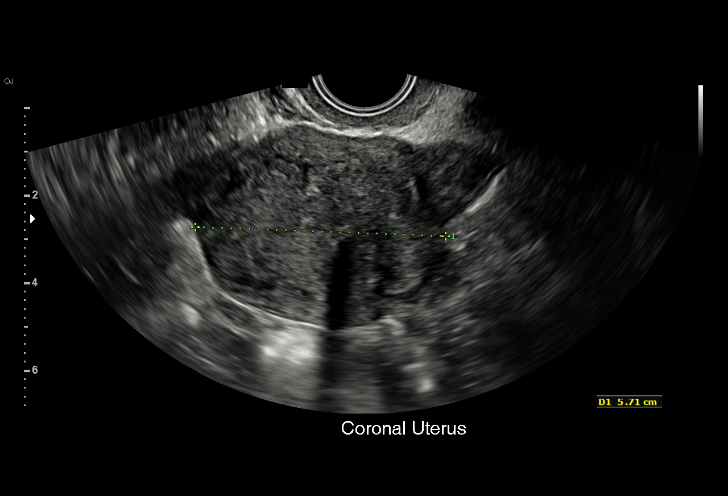
[im 79/87]
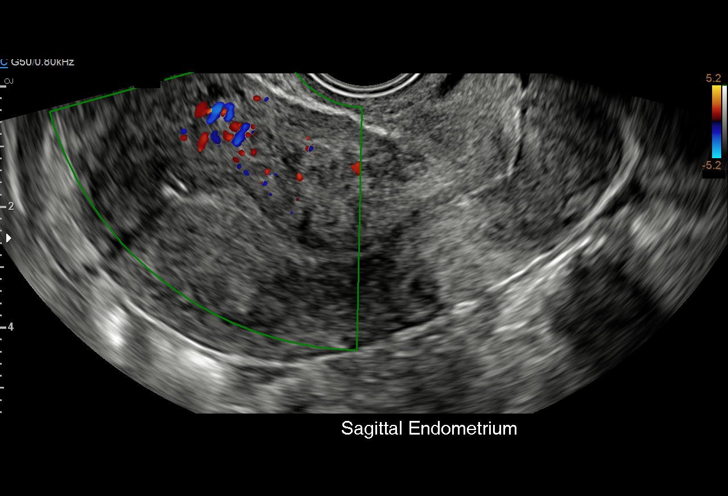
[im 87/87]
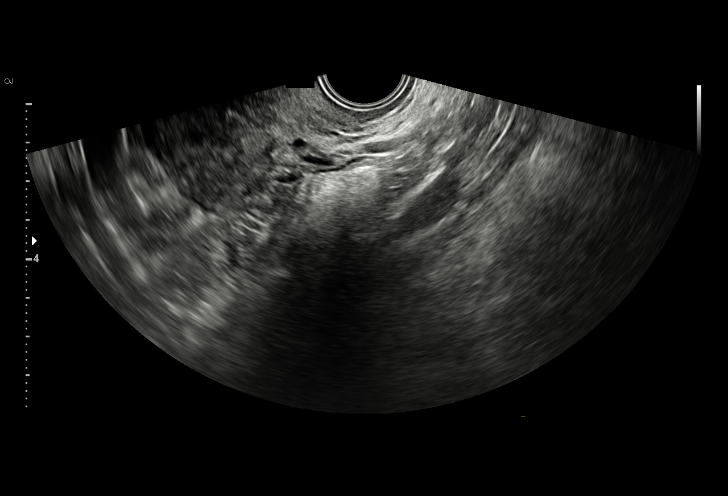

[14 of 25 positions shown; findings below may reference images not displayed]

FINDINGS: Uterus

Measurements: 10.3 x 4.8 x 5.7 cm. Mildly enlarged anteverted uterus
with no uterine fibroids. Mild diffuse myometrial heterogeneity and
indistinct endometrial myometrial interface, suggesting mild diffuse
adenomyosis.

Endometrium

Thickness: 2 mm. Intrauterine device appears well positioned within
the fundal endometrial cavity, with no evidence of myometrial
penetration by the side arms of the device. Endometrial
visualization is limited by acoustic shadowing due to the IUD. No
endometrial cavity fluid or focal endometrial mass.

Right ovary

Measurements: 2.0 x 1.7 x 1.7 cm (volume = 3.0 cm^3). Normal
appearance/no adnexal mass. No evidence of abnormally increased
immature peripheral follicles in the right ovary (less than 5
identified).

Left ovary

Measurements: 2.3 x 1.2 x 2.1 cm (volume = 3.0 cm^3), seen only on
the transabdominal scan due to bowel gas on the transvaginal scan.
Normal appearance/no adnexal mass. No evidence of abnormally
increased immature peripheral follicles in the left ovary on the
transabdominal images.

Other findings

No abnormal free fluid.
IMPRESSION: 1. Well-positioned IUD in the uterine cavity.
2. Spectrum of findings suggestive of mild diffuse adenomyosis of
the uterus. No uterine fibroids.
3. Normal ovaries, which do not meet sonographic criteria for
polycystic ovarian syndrome, see comments. No adnexal masses.

## 2018-05-08 ENCOUNTER — Other Ambulatory Visit: Payer: Self-pay

## 2018-05-08 ENCOUNTER — Emergency Department (HOSPITAL_COMMUNITY)
Admission: EM | Admit: 2018-05-08 | Discharge: 2018-05-08 | Disposition: A | Discharge status: Home / Self Care | Payer: Self-pay | Attending: Emergency Medicine | Admitting: Emergency Medicine

## 2018-05-08 ENCOUNTER — Encounter (HOSPITAL_COMMUNITY): Payer: Self-pay | Admitting: Emergency Medicine

## 2018-05-08 ENCOUNTER — Emergency Department (HOSPITAL_COMMUNITY): Payer: Self-pay

## 2018-05-08 DIAGNOSIS — R05 Cough: Secondary | ICD-10-CM | POA: Insufficient documentation

## 2018-05-08 DIAGNOSIS — R0981 Nasal congestion: Secondary | ICD-10-CM | POA: Insufficient documentation

## 2018-05-08 DIAGNOSIS — Z87891 Personal history of nicotine dependence: Secondary | ICD-10-CM | POA: Insufficient documentation

## 2018-05-08 DIAGNOSIS — R590 Localized enlarged lymph nodes: Secondary | ICD-10-CM | POA: Insufficient documentation

## 2018-05-08 DIAGNOSIS — J029 Acute pharyngitis, unspecified: Secondary | ICD-10-CM

## 2018-05-08 LAB — GROUP A STREP BY PCR: Group A Strep by PCR: NOT DETECTED

## 2018-05-08 MED ORDER — PENICILLIN G BENZATHINE 1200000 UNIT/2ML IM SUSP
1.2000 10*6.[IU] | Freq: Once | INTRAMUSCULAR | Status: AC
  Administered 2018-05-08: 1.2 10*6.[IU] via INTRAMUSCULAR
  Filled 2018-05-08: qty 2

## 2018-05-08 MED ORDER — DEXAMETHASONE SODIUM PHOSPHATE 10 MG/ML IJ SOLN
10.0000 mg | Freq: Once | INTRAMUSCULAR | Status: AC
  Administered 2018-05-08: 10 mg via INTRAMUSCULAR
  Filled 2018-05-08: qty 1

## 2018-05-08 NOTE — ED Provider Notes (Signed)
[facility]-EMERGENCY DEPT Provider Note   CSN: [id number] Arrival date & time: 05/08/18  1619    History   Chief Complaint Chief Complaint  Patient presents with  . Sore Throat  . Nasal Congestion    HPI Cheryl Wolfe is a 33 y.o. female who presents to the ED with complaints of URI symptoms that began 3-4 days prior. Patient reports nasal congestion, sinus pressure/pain, sore throat, and productive cough w/ mucous sputum. Sxs constant. Pain in throat worse with swallowing (able to swallow), no alleviating factors. She reports sxs felt similar to prior strep throat so she took a total of 4 doses of leftover amoxicillin over the past 2 days (BID dosing).  Denies fever, chills, drooling, change in voice, ear pain, dyspnea, wheezing, or chest pain.  Denies recent sick contacts with similar symptoms.  Denies covid 19 exposure.  Denies recent foreign travel.     HPI  Past Medical History:  Diagnosis Date  . Gestational diabetes     Patient Active Problem List   Diagnosis Date Noted  . Hirsutism 03/20/2017  . Pregnant and not yet delivered 11/25/2015  . Gestational diabetes 11/15/2015  . Supervision of normal pregnancy, antepartum 10/15/2015    Past Surgical History:  Procedure Laterality Date  . COSMETIC SURGERY     Tummy Tuck      OB History    Gravida  4   Para  4   Term  4   Preterm      AB      Living  4     SAB      TAB      Ectopic      Multiple  0   Live Births  4            Home Medications    Prior to Admission medications   Medication Sig Start Date End Date Taking? Authorizing Provider  erythromycin ophthalmic ointment Place a 1/2 inch ribbon of ointment into the lower eyelid every 4 hours for 5 days 08/30/17   Muthersbaugh, Dahlia Client, PA-C  ibuprofen (ADVIL,MOTRIN) 800 MG tablet Take 1 tablet (800 mg total) by mouth every 8 (eight) hours as needed. 06/08/17   Brock Bad, MD  lidocaine (XYLOCAINE) 2 %  solution Use as directed 15 mLs in the mouth or throat as needed for mouth pain. 06/19/17   Maczis, Elmer Sow, PA-C  magic mouthwash w/lidocaine SOLN Take 5 mLs by mouth 3 (three) times daily as needed for mouth pain. 08/30/17   Muthersbaugh, Dahlia Client, PA-C  naproxen (NAPROSYN) 500 MG tablet Take 1 tablet (500 mg total) by mouth 2 (two) times daily. 06/19/17   Maczis, Elmer Sow, PA-C  nitrofurantoin, macrocrystal-monohydrate, (MACROBID) 100 MG capsule Take 1 capsule (100 mg total) by mouth 2 (two) times daily. 06/08/17   Brock Bad, MD    Family History Family History  Problem Relation Age of Onset  . Healthy Mother   . Hyperlipidemia Father     Social History Social History   Tobacco Use  . Smoking status: Former Games developer  . Smokeless tobacco: Never Used  Substance Use Topics  . Alcohol use: No  . Drug use: No     Allergies   Patient has no known allergies.   Review of Systems Review of Systems  Constitutional: Negative for chills and fever.  HENT: Positive for congestion, sinus pressure, sinus pain, sore throat and trouble swallowing (painful but able). Negative for ear pain and voice  change.   Eyes: Negative for visual disturbance.  Respiratory: Positive for cough. Negative for shortness of breath and wheezing.   Cardiovascular: Negative for chest pain.  Gastrointestinal: Negative for abdominal pain, diarrhea and vomiting.  Musculoskeletal: Negative for myalgias.  Neurological: Negative for weakness and numbness.     Physical Exam Updated Vital Signs BP 116/82 (BP Location: Left Arm)   Pulse 67   Temp 97.7 F (36.5 C) (Oral)   Resp 18   Wt 81.6 kg   LMP 04/15/2018 (Approximate)   SpO2 96%   BMI 32.92 kg/m   Physical Exam Vitals signs and nursing note reviewed.  Constitutional:      General: She is not in acute distress.    Appearance: She is well-developed.  HENT:     Head: Normocephalic and atraumatic.     Right Ear: Ear canal normal. Tympanic membrane  is not perforated, erythematous, retracted or bulging.     Left Ear: Ear canal normal. Tympanic membrane is not perforated, erythematous, retracted or bulging.     Ears:     Comments: No mastoid erythema/swelling/tenderness.     Nose:     Right Sinus: No maxillary sinus tenderness or frontal sinus tenderness.     Left Sinus: No maxillary sinus tenderness or frontal sinus tenderness.     Mouth/Throat:     Pharynx: Uvula midline. Oropharyngeal exudate (minimal) and posterior oropharyngeal erythema present.     Comments: Posterior oropharynx is symmetric appearing. Patient tolerating own secretions without difficulty. No trismus. No drooling. No hot potato voice. No swelling beneath the tongue, submandibular compartment is soft.  Eyes:     General:        Right eye: No discharge.        Left eye: No discharge.     Conjunctiva/sclera: Conjunctivae normal.     Pupils: Pupils are equal, round, and reactive to light.  Neck:     Musculoskeletal: Normal range of motion and neck supple. No edema or neck rigidity.  Cardiovascular:     Rate and Rhythm: Normal rate and regular rhythm.     Heart sounds: No murmur.  Pulmonary:     Effort: Pulmonary effort is normal. No respiratory distress.     Breath sounds: Normal breath sounds. No wheezing, rhonchi or rales.  Abdominal:     General: There is no distension.     Palpations: Abdomen is soft.     Tenderness: There is no abdominal tenderness.  Lymphadenopathy:     Cervical: Cervical adenopathy (mild) present.  Skin:    General: Skin is warm and dry.     Findings: No rash.  Neurological:     Mental Status: She is alert.  Psychiatric:        Behavior: Behavior normal.      ED Treatments / Results  Labs (all labs ordered are listed, but only abnormal results are displayed) Labs Reviewed  GROUP A STREP BY PCR    EKG None  Radiology Dg Chest 2 View  Result Date: 05/08/2018 CLINICAL DATA:  Cough and sore throat EXAM: CHEST - 2 VIEW  COMPARISON:  None. FINDINGS: Lungs are clear. Heart size and pulmonary vascularity are normal. No adenopathy. No bone lesions. IMPRESSION: No edema or consolidation. Electronically Signed   By: Bretta Bang III M.D.   On: 05/08/2018 18:13    Procedures Procedures (including critical care time)  Medications Ordered in ED Medications  penicillin g benzathine (BICILLIN LA) 1200000 UNIT/2ML injection 1.2 Million Units (has no  administration in time range)  dexamethasone (DECADRON) injection 10 mg (has no administration in time range)     Initial Impression / Assessment and Plan / ED Course  I have reviewed the triage vital signs and the nursing notes.  Pertinent labs & imaging results that were available during my care of the patient were reviewed by me and considered in my medical decision making (see chart for details).    Patient presents with URI type symptoms.  Patient is nontoxic appearing, in no apparent distress, vitals are WNL. Patient is afebrile in the ED, lungs are CTA, CXR negative for infiltrate, doubt pneumonia. There is no wheezing or signs of respiratory distress. Sxs onset < 7 days, afebrile, no sinus tenderness, doubt acute bacterial sinusitis.  No meningeal signs.  No foreign travel or direct coronavirus exposure. Without fever feel flu is unlikely. Exam does not appear consistent with RPA/PTA.  Strep by PCR is negative, however this is in the setting of patient having taken multiple doses of amoxicillin, unclear if this has led to negative test, she states this feels exactly like her prior strep throat.  Discussed the options of watch and wait with supportive treatment and reevaluation to determine if antibiotics are necessary versus complete antibiotic treatment for presumed strep throat in the setting of negative test while already taking antibiotics.  Her/benefits discussed.  Patient would prefer antibiotics.  She will be given IM Bicillin for possible strep as well as IM  Decadron for symptomatic relief.  Recommended over-the-counter Motrin, Tylenol, and nasal decongestants as needed for continued symptoms. We also discussed medical evaluation prior to abx use in the future.  I discussed results, treatment plan, need for PCP follow-up, and return precautions with the patient. Provided opportunity for questions, patient confirmed understanding and is in agreement with plan.     Final Clinical Impressions(s) / ED Diagnoses   Final diagnoses:  Pharyngitis, unspecified etiology    ED Discharge Orders    None       Cherly Anderson, PA-C 05/08/18 1928    Virgina Norfolk, DO 05/08/18 2329

## 2018-05-08 NOTE — Discharge Instructions (Addendum)
You are seen in the emergency department today for upper respiratory infection type symptoms.  Your chest x-ray was normal.  Your strep test was negative.  We have covered you for possible strep with a shot of antibiotics in the ER.  You were also given a shot of steroids to help with your symptoms.  Please take Motrin, Tylenol, and utilize nasal decongestions per over-the-counter dosing to assist with any further symptoms.  Please follow-up with primary care in 3 to 5 days.  Return to the ER for new or worsening symptoms including but not limited to worsening pain, trouble breathing, inability to swallow, drooling, change in voice, fever, or any other concerns.

## 2018-05-12 ENCOUNTER — Other Ambulatory Visit: Payer: Self-pay

## 2018-05-12 ENCOUNTER — Encounter (HOSPITAL_COMMUNITY): Payer: Self-pay

## 2018-05-12 ENCOUNTER — Emergency Department (HOSPITAL_COMMUNITY)
Admission: EM | Admit: 2018-05-12 | Discharge: 2018-05-12 | Disposition: A | Discharge status: Home / Self Care | Payer: Medicaid Other | Attending: Emergency Medicine | Admitting: Emergency Medicine

## 2018-05-12 DIAGNOSIS — Z79899 Other long term (current) drug therapy: Secondary | ICD-10-CM | POA: Insufficient documentation

## 2018-05-12 DIAGNOSIS — J069 Acute upper respiratory infection, unspecified: Secondary | ICD-10-CM | POA: Insufficient documentation

## 2018-05-12 DIAGNOSIS — Z87891 Personal history of nicotine dependence: Secondary | ICD-10-CM | POA: Insufficient documentation

## 2018-05-12 LAB — INFLUENZA PANEL BY PCR (TYPE A & B)
INFLAPCR: NEGATIVE
Influenza B By PCR: NEGATIVE

## 2018-05-12 MED ORDER — IPRATROPIUM-ALBUTEROL 0.5-2.5 (3) MG/3ML IN SOLN
3.0000 mL | Freq: Once | RESPIRATORY_TRACT | Status: AC
  Administered 2018-05-12: 3 mL via RESPIRATORY_TRACT
  Filled 2018-05-12: qty 3

## 2018-05-12 MED ORDER — BENZONATATE 100 MG PO CAPS: 100.0000 mg | ORAL_CAPSULE | Freq: Three times a day (TID) | ORAL | 0 refills | Status: AC

## 2018-05-12 MED ORDER — ALBUTEROL SULFATE HFA 108 (90 BASE) MCG/ACT IN AERS
2.0000 | INHALATION_SPRAY | RESPIRATORY_TRACT | Status: DC | PRN
  Administered 2018-05-12: 2 via RESPIRATORY_TRACT
  Filled 2018-05-12: qty 6.7

## 2018-05-12 NOTE — ED Triage Notes (Signed)
Patient c/o a productive cough with green sputum, nasal congestion, and sore throat. Patient reports that she was seen in the ED 4 days ago and received PCN and a steroid prior to discharge. Patient states she is not feeling any better. Patient works in a daycare.

## 2018-05-12 NOTE — ED Provider Notes (Addendum)
[facility]-EMERGENCY DEPT Provider Note   CSN: [id number] Arrival date & time: 05/12/18  1228    History   Chief Complaint Chief Complaint  Patient presents with  . Cough  . Sore Throat  . Nasal Congestion    HPI Cheryl Wolfe is a 33 y.o. female who presents to the ED with productive cough and sore throat that started 4 days ago. Patient reports being evaluated in the ED 4 days ago and treated with penicillin injection and decadron 10 mg. Patient reports she continues to have a cough and feels tired. 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      The history is provided by the patient. No language interpreter was used.  URI  Presenting symptoms: congestion, cough, fever and sore throat   Severity:  Moderate Onset quality:  Gradual Duration:  4 days Timing:  Constant Progression:  Worsening Chronicity:  New Relieved by:  Nothing Worsened by:  Nothing Ineffective treatments:  Prescription medications Associated symptoms: myalgias   Associated symptoms: no headaches   Risk factors: sick contacts   Risk factors: no recent travel     Past Medical History:  Diagnosis Date  . Gestational diabetes     Patient Active Problem List   Diagnosis Date Noted  . Hirsutism 03/20/2017  . Pregnant and not yet delivered 11/25/2015  . Gestational diabetes 11/15/2015  . Supervision of normal pregnancy, antepartum 10/15/2015    Past Surgical History:  Procedure Laterality Date  . COSMETIC SURGERY     Tummy Tuck      OB History    Gravida  4   Para  4   Term  4   Preterm      AB      Living  4     SAB      TAB      Ectopic      Multiple  0   Live Births  4            Home Medications    Prior to Admission medications   Medication Sig Start Date End Date Taking? Authorizing Provider  benzonatate (TESSALON) 100 MG capsule Take 1 capsule (100 mg total) by mouth every 8 (eight)  hours. 05/12/18   Janne Napoleon, NP  erythromycin ophthalmic ointment Place a 1/2 inch ribbon of ointment into the lower eyelid every 4 hours for 5 days 08/30/17   Muthersbaugh, Dahlia Client, PA-C  ibuprofen (ADVIL,MOTRIN) 800 MG tablet Take 1 tablet (800 mg total) by mouth every 8 (eight) hours as needed. 06/08/17   Brock Bad, MD  lidocaine (XYLOCAINE) 2 % solution Use as directed 15 mLs in the mouth or throat as needed for mouth pain. 06/19/17   Maczis, Elmer Sow, PA-C  magic mouthwash w/lidocaine SOLN Take 5 mLs by mouth 3 (three) times daily as needed for mouth pain. 08/30/17   Muthersbaugh, Dahlia Client, PA-C  naproxen (NAPROSYN) 500 MG tablet Take 1 tablet (500 mg total) by mouth 2 (two) times daily. 06/19/17   Maczis, Elmer Sow, PA-C  nitrofurantoin, macrocrystal-monohydrate, (MACROBID) 100 MG capsule Take 1 capsule (100 mg total) by mouth 2 (two) times daily. 06/08/17   Brock Bad, MD    Family History Family History  Problem Relation Age of Onset  . Healthy Mother   . Hyperlipidemia Father     Social History Social History   Tobacco Use  . Smoking status: Former Games developer  .  Smokeless tobacco: Never Used  Substance Use Topics  . Alcohol use: No  . Drug use: No     Allergies   Patient has no known allergies.   Review of Systems Review of Systems  Constitutional: Positive for fever.  HENT: Positive for congestion, sinus pressure and sore throat.   Eyes: Negative for pain, discharge and itching.  Respiratory: Positive for cough.   Genitourinary: Negative for decreased urine volume and dysuria.  Musculoskeletal: Positive for myalgias.  Skin: Negative for rash.  Neurological: Negative for headaches.  Psychiatric/Behavioral: Negative for confusion.     Physical Exam Updated Vital Signs BP 130/87   Pulse 93   Temp 98.4 F (36.9 C) (Oral)   Resp 18   Ht 5\' 1"  (1.549 m)   Wt 81.6 kg   LMP 04/15/2018 (Approximate)   SpO2 100%   BMI 33.99 kg/m   Physical Exam Vitals  signs and nursing note reviewed.  Constitutional:      General: She is not in acute distress.    Appearance: She is well-developed.  HENT:     Head: Normocephalic.     Right Ear: Tympanic membrane normal.     Left Ear: Tympanic membrane normal.     Mouth/Throat:     Mouth: Mucous membranes are moist.     Pharynx: Uvula midline. Posterior oropharyngeal erythema present. No pharyngeal swelling or oropharyngeal exudate.  Neck:     Musculoskeletal: Neck supple.  Cardiovascular:     Rate and Rhythm: Normal rate and regular rhythm.  Pulmonary:     Effort: Pulmonary effort is normal. No respiratory distress.     Breath sounds: Decreased air movement present.  Abdominal:     Palpations: Abdomen is soft.     Tenderness: There is no abdominal tenderness.  Musculoskeletal: Normal range of motion.  Skin:    General: Skin is warm and dry.  Neurological:     Mental Status: She is alert and oriented to person, place, and time.     Cranial Nerves: No cranial nerve deficit.      ED Treatments / Results  Labs (all labs ordered are listed, but only abnormal results are displayed) Labs Reviewed  INFLUENZA PANEL BY PCR (TYPE A & B)    Radiology No results found.  Procedures Procedures (including critical care time)  Medications Ordered in ED Medications  albuterol (PROVENTIL HFA;VENTOLIN HFA) 108 (90 Base) MCG/ACT inhaler 2 puff (has no administration in time range)  ipratropium-albuterol (DUONEB) 0.5-2.5 (3) MG/3ML nebulizer solution 3 mL (3 mLs Nebulization Given 05/12/18 1526)   Patient reports feeling better after neb treatment. Re examined and there is better air movement, no wheezing or rales heard.   Initial Impression / Assessment and Plan / ED Course  I have reviewed the triage vital signs and the nursing notes. Pt CXR 4 days ago negative for acute infiltrate. Patients symptoms are consistent with URI. Patient already treated for bacterial pharyngitis 4 days ago. Discussed  that additional antibiotics are not indicated at this time. Pt will be discharged with albuterol inhaler and symptomatic treatment.  Verbalizes understanding and is agreeable with plan. Pt is hemodynamically stable & in NAD prior to dc.  Final Clinical Impressions(s) / ED Diagnoses   Final diagnoses:  Acute URI    ED Discharge Orders         Ordered    benzonatate (TESSALON) 100 MG capsule  Every 8 hours     05/12/18 1609  Kerrie Buffalo [place], [place] 05/12/18 1613  During d/c patient was concerned that she had not been tested for Covid 19. Discussed in detail with the patient testing criteria and gave written information from Health and Human Service and other information on Covid 19. Patient voices understanding and agrees with plan.    Kerrie Buffalo [place], [place] 05/12/18 1647    Azalia Bilis, MD 05/13/18 [phone]

## 2018-05-12 NOTE — Discharge Instructions (Addendum)
Use the inhaler 2 puffs every 4 hours as needed. Take tylenol and ibuprofen as needed for body aches or fever. Follow up with your doctor or return here as needed for worsening symptoms. Be sure to drink plenty of fluids to prevent dehydration.   Coronavirus (COVID-19) Are you at risk?  Are you at risk for the Coronavirus (COVID-19)?  To be considered HIGH RISK for Coronavirus (COVID-19), you have to meet the following criteria:  Traveled to [place], [place], [place], [place] or [place]; or in the [place] to [place], [place], [place], or [place]; and have fever, cough, and shortness of breath within the last 2 weeks of travel OR Been in close contact with a person diagnosed with COVID-19 within the last 2 weeks and have fever, cough, and shortness of breath IF YOU DO NOT MEET THESE CRITERIA, YOU ARE CONSIDERED LOW RISK FOR COVID-19.  What to do if you are HIGH RISK for COVID-19?  If you are having a medical emergency, call 911. Seek medical care right away. Before you go to a doctors office, urgent care or emergency department, call ahead and tell them about your recent travel, contact with someone diagnosed with COVID-19, and your symptoms. You should receive instructions from your physicians office regarding next steps of care.  When you arrive at healthcare provider, tell the healthcare staff immediately you have returned from visiting [place], [place], [place], [place] or [place]; or traveled in the [place] to [place], [place], [place], or [place]; in the last two weeks or you have been in close contact with a person diagnosed with COVID-19 in the last 2 weeks.   Tell the health care staff about your symptoms: fever, cough and shortness of breath. After you have been seen by a medical provider, you will be either: Tested for (COVID-19) and discharged home on quarantine except to seek medical care if symptoms worsen, and asked to  Stay home and avoid contact with  others until you get your results (4-5 days)  Avoid travel on public transportation if possible (such as bus, train, or airplane) or Sent to the Emergency Department by EMS for evaluation, COVID-19 testing, and possible admission depending on your condition and test results.  What to do if you are LOW RISK for COVID-19?  Reduce your risk of any infection by using the same precautions used for avoiding the common cold or flu:  Wash your hands often with soap and warm water for at least 20 seconds.  If soap and water are not readily available, use an alcohol-based hand sanitizer with at least 60% alcohol.  If coughing or sneezing, cover your mouth and nose by coughing or sneezing into the elbow areas of your shirt or coat, into a tissue or into your sleeve (not your hands). Avoid shaking hands with others and consider head nods or verbal greetings only. Avoid touching your eyes, nose, or mouth with unwashed hands.  Avoid close contact with people who are sick. Avoid places or events with large numbers of people in one location, like concerts or sporting events. Carefully consider travel plans you have or are making. If you are planning any travel outside or inside the [place], visit the [facility] webpage for the latest health notices. If you have some symptoms but not all symptoms, continue to monitor at home and seek medical attention if your symptoms worsen. If you are having a medical emergency, call 911.   ADDITIONAL HEALTHCARE OPTIONS FOR  PATIENTS  [facility] / e-Visit: [email or web address]         [facility]: [phone]  [facility]: [phone]                   [facility]: [phone]

## 2018-05-12 NOTE — ED Notes (Signed)
DELAY IN DISCHARGE. PT INQUIRING ON COV-19 TESTING. GIVEN REQUIREMENTS FOR INDICATIONS FOR TESTING. PT CONTINUES TO ASK ADDITIONAL QUESTIONS. PT INFORMED WILL ADVISE NP NEESE OF HER REQUEST.  NP NEESE AWARE OF PT'S REQUEST.

## 2019-05-24 IMAGING — CR CHEST - 2 VIEW
2 series · 2 of 2 positions shown · non-contrast
Comparison: None.

CLINICAL DATA: Cough and sore throat

EXAM:
CHEST - 2 VIEW

[w chest pa]
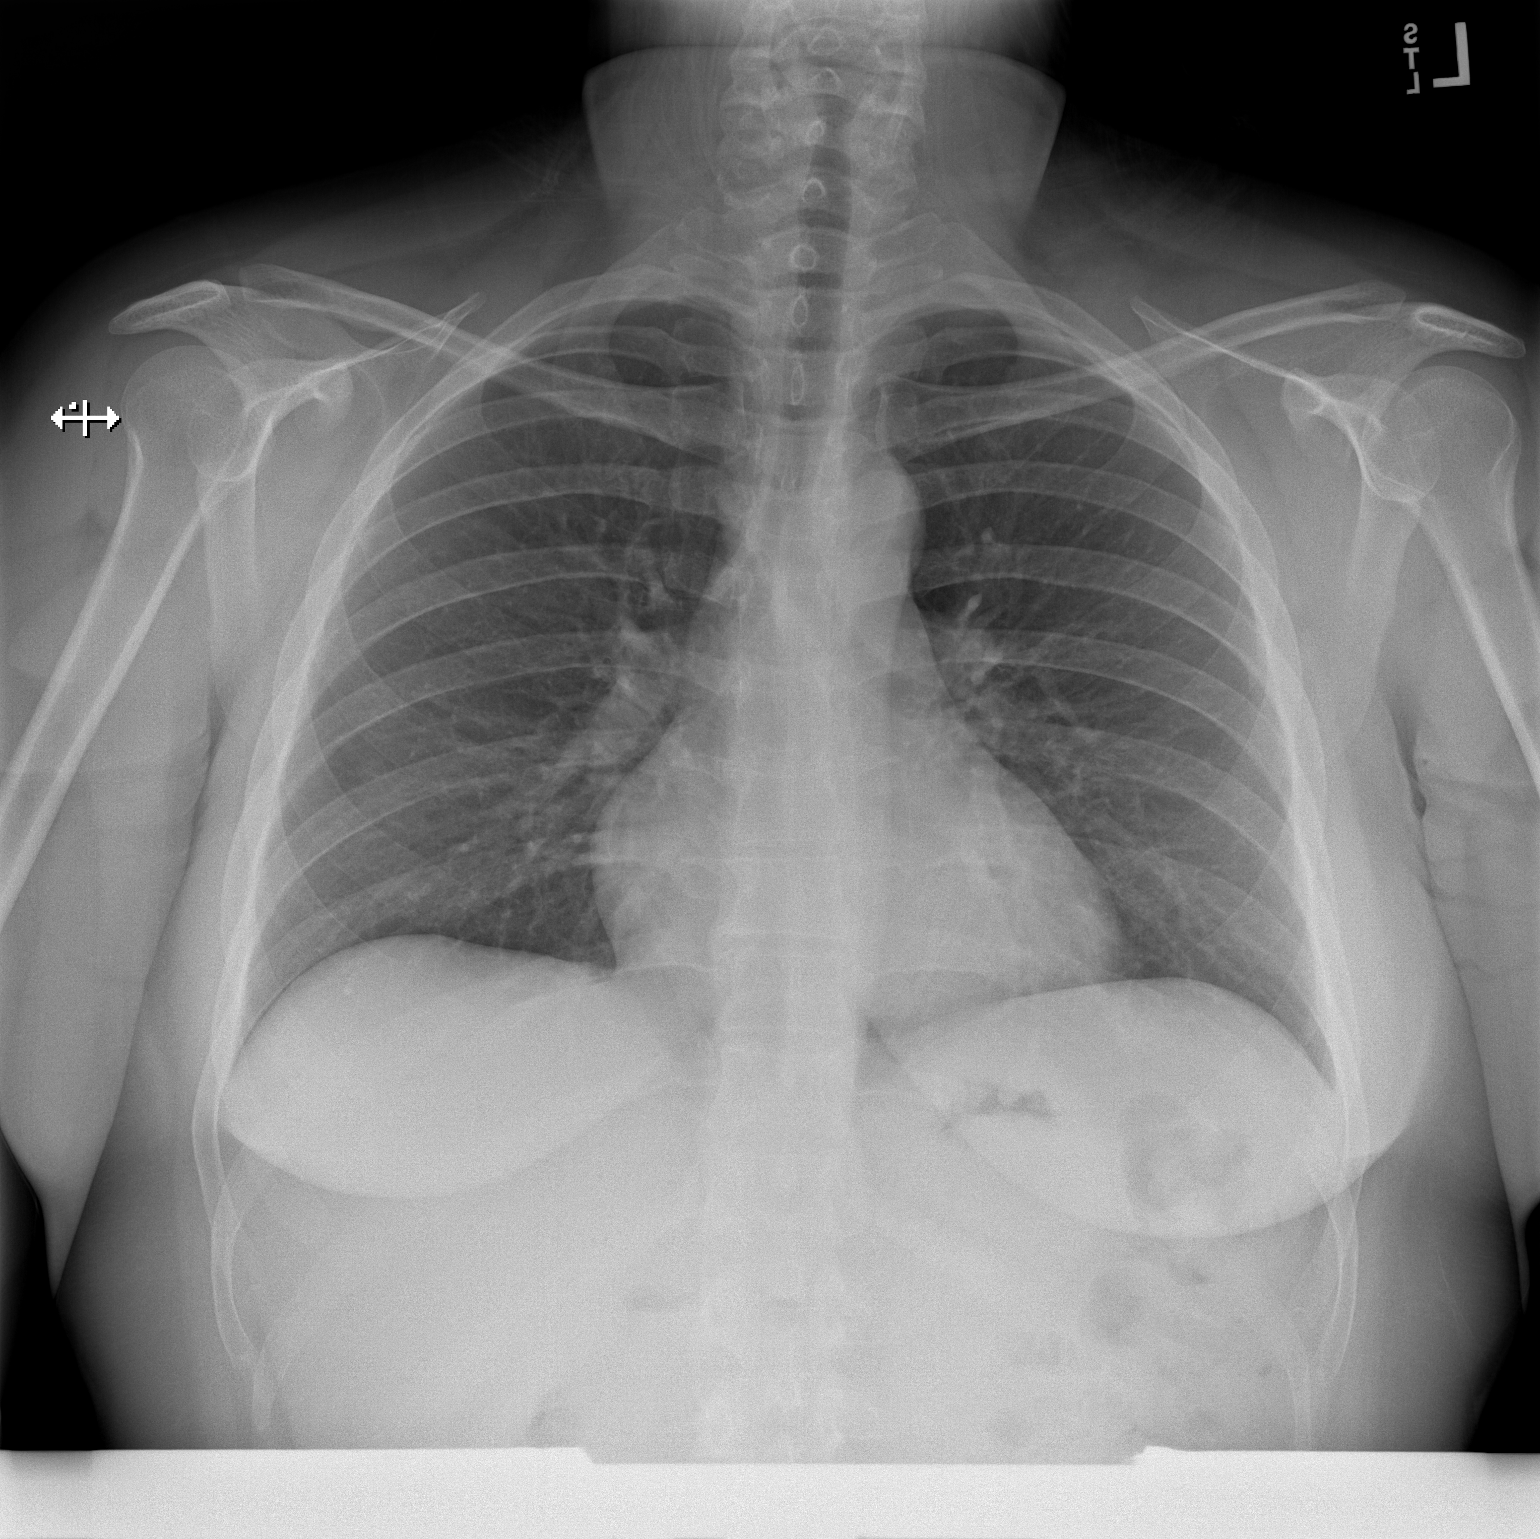

[w chest lat]
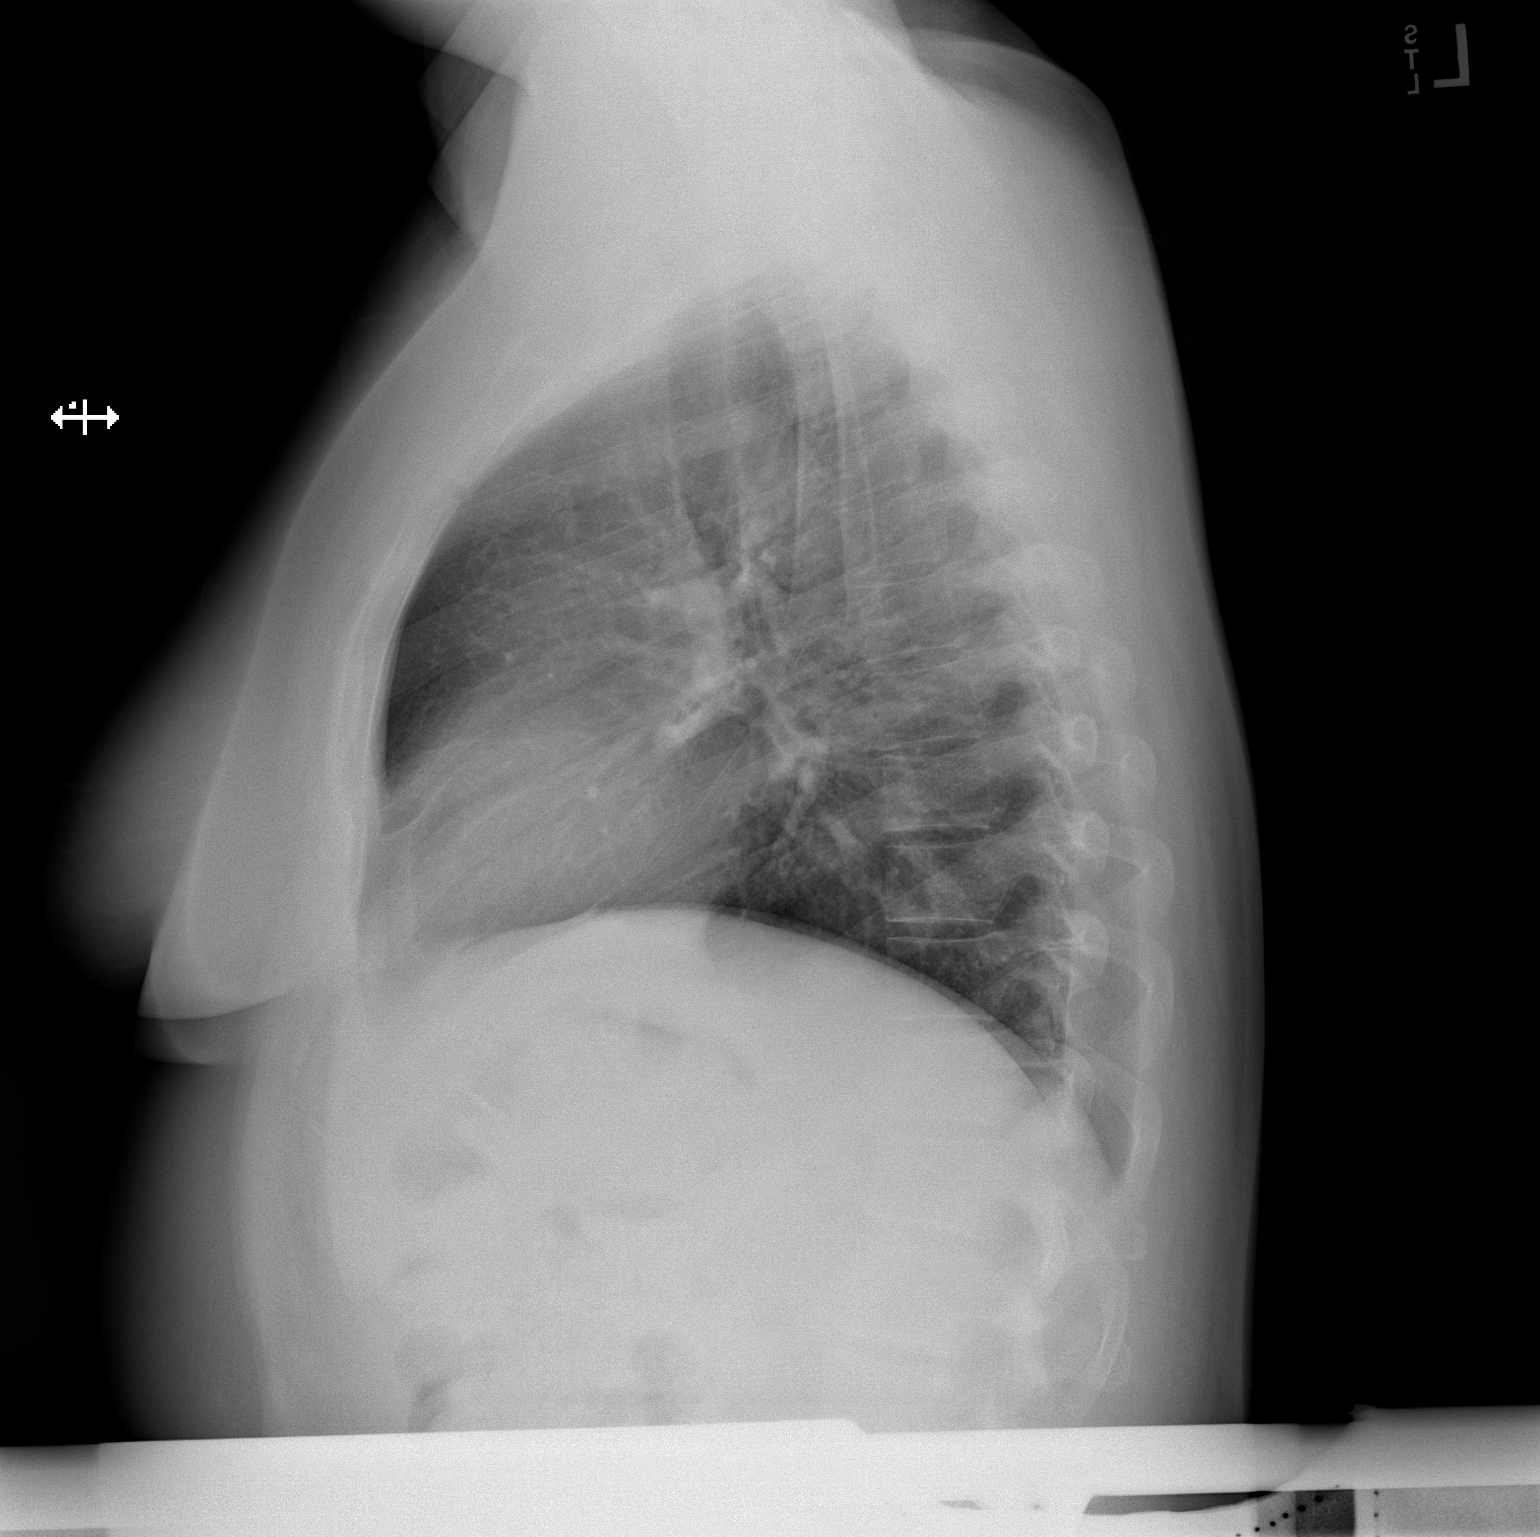

[2 of 2 positions shown; findings below may reference images not displayed]

FINDINGS: Lungs are clear. Heart size and pulmonary vascularity are normal. No
adenopathy. No bone lesions.
IMPRESSION: No edema or consolidation.
